# Patient Record
Sex: Female | Born: 1960
Health system: Southern US, Community
[De-identification: ages and names within clinical notes are randomized; demographics above are authoritative.]

## PROBLEM LIST (undated history)

## (undated) DIAGNOSIS — C44521 Squamous cell carcinoma of skin of breast: Secondary | ICD-10-CM

## (undated) DIAGNOSIS — M1990 Unspecified osteoarthritis, unspecified site: Secondary | ICD-10-CM

## (undated) DIAGNOSIS — F32A Depression, unspecified: Secondary | ICD-10-CM

## (undated) DIAGNOSIS — Z973 Presence of spectacles and contact lenses: Secondary | ICD-10-CM

## (undated) DIAGNOSIS — F419 Anxiety disorder, unspecified: Secondary | ICD-10-CM

## (undated) DIAGNOSIS — K5792 Diverticulitis of intestine, part unspecified, without perforation or abscess without bleeding: Secondary | ICD-10-CM

## (undated) DIAGNOSIS — R51 Headache: Secondary | ICD-10-CM

## (undated) DIAGNOSIS — R519 Headache, unspecified: Secondary | ICD-10-CM

## (undated) DIAGNOSIS — N8110 Cystocele, unspecified: Secondary | ICD-10-CM

## (undated) DIAGNOSIS — Z9889 Other specified postprocedural states: Secondary | ICD-10-CM

## (undated) DIAGNOSIS — B019 Varicella without complication: Secondary | ICD-10-CM

## (undated) DIAGNOSIS — K921 Melena: Secondary | ICD-10-CM

## (undated) DIAGNOSIS — K5909 Other constipation: Secondary | ICD-10-CM

## (undated) DIAGNOSIS — T7840XA Allergy, unspecified, initial encounter: Secondary | ICD-10-CM

## (undated) DIAGNOSIS — R112 Nausea with vomiting, unspecified: Secondary | ICD-10-CM

## (undated) DIAGNOSIS — H269 Unspecified cataract: Secondary | ICD-10-CM

## (undated) DIAGNOSIS — F329 Major depressive disorder, single episode, unspecified: Secondary | ICD-10-CM

## (undated) HISTORY — PX: COLONOSCOPY: SHX174

## (undated) HISTORY — DX: Depression, unspecified: F32.A

## (undated) HISTORY — DX: Diverticulitis of intestine, part unspecified, without perforation or abscess without bleeding: K57.92

## (undated) HISTORY — PX: APPENDECTOMY: SHX54

## (undated) HISTORY — DX: Unspecified cataract: H26.9

## (undated) HISTORY — DX: Melena: K92.1

## (undated) HISTORY — DX: Major depressive disorder, single episode, unspecified: F32.9

## (undated) HISTORY — PX: HEMORRHOID BANDING: SHX5850

## (undated) HISTORY — DX: Varicella without complication: B01.9

## (undated) HISTORY — PX: CHOLECYSTECTOMY: SHX55

## (undated) HISTORY — DX: Allergy, unspecified, initial encounter: T78.40XA

## (undated) HISTORY — PX: ABDOMINAL HYSTERECTOMY: SHX81

## (undated) HISTORY — DX: Squamous cell carcinoma of skin of breast: C44.521

## (undated) HISTORY — PX: CYSTOCELE REPAIR: SHX163

---

## 2002-11-14 HISTORY — PX: BREAST CYST ASPIRATION: SHX578

## 2004-12-07 ENCOUNTER — Ambulatory Visit: Payer: Self-pay | Admitting: Obstetrics and Gynecology

## 2006-08-02 ENCOUNTER — Ambulatory Visit: Payer: Self-pay | Admitting: Family Medicine

## 2006-08-02 ENCOUNTER — Ambulatory Visit: Payer: Self-pay | Admitting: Obstetrics and Gynecology

## 2007-09-20 ENCOUNTER — Ambulatory Visit: Payer: Self-pay

## 2007-11-21 ENCOUNTER — Ambulatory Visit: Payer: Self-pay | Admitting: Gastroenterology

## 2008-04-28 ENCOUNTER — Ambulatory Visit: Payer: Self-pay | Admitting: Obstetrics and Gynecology

## 2008-09-05 LAB — HM MAMMOGRAPHY: HM Mammogram: NORMAL

## 2009-07-03 ENCOUNTER — Emergency Department: Payer: Self-pay | Admitting: Emergency Medicine

## 2011-01-27 ENCOUNTER — Other Ambulatory Visit: Payer: Self-pay | Admitting: Internal Medicine

## 2011-11-09 ENCOUNTER — Other Ambulatory Visit: Payer: Self-pay | Admitting: *Deleted

## 2011-11-09 MED ORDER — ESTRADIOL 1 MG PO TABS: 1.0000 mg | ORAL_TABLET | Freq: Every day | ORAL | Status: DC

## 2011-11-09 NOTE — Telephone Encounter (Signed)
Faxed request from [facility], last filled 08/08/11.

## 2012-02-22 ENCOUNTER — Telehealth: Payer: Self-pay | Admitting: Internal Medicine

## 2012-02-22 ENCOUNTER — Observation Stay: Payer: Self-pay | Admitting: Surgery

## 2012-02-22 ENCOUNTER — Ambulatory Visit: Payer: Self-pay | Admitting: Internal Medicine

## 2012-02-22 DIAGNOSIS — K5792 Diverticulitis of intestine, part unspecified, without perforation or abscess without bleeding: Secondary | ICD-10-CM

## 2012-02-22 LAB — CBC
MCV: 95 fL (ref 80–100)
RDW: 12.6 % (ref 11.5–14.5)
WBC: 9 10*3/uL (ref 3.6–11.0)

## 2012-02-22 LAB — COMPREHENSIVE METABOLIC PANEL
Albumin: 3.9 g/dL (ref 3.4–5.0)
BUN: 9 mg/dL (ref 7–18)
Bilirubin,Total: 0.7 mg/dL (ref 0.2–1.0)
Co2: 27 mmol/L (ref 21–32)
Creatinine: 0.61 mg/dL (ref 0.60–1.30)
EGFR (African American): >60
EGFR (Non-African Amer.): >60
Osmolality: 273 (ref 275–301)
Potassium: 3.3 mmol/L — ABNORMAL LOW (ref 3.5–5.1)
Sodium: 137 mmol/L (ref 136–145)
Total Protein: 8 g/dL (ref 6.4–8.2)

## 2012-02-22 LAB — DIFFERENTIAL
Lymphocyte #: 2.5 10*3/uL (ref 1.0–3.6)
Monocyte #: 0.7 x10 3/mm (ref 0.2–0.9)
Neutrophil #: 5.3 10*3/uL (ref 1.4–6.5)

## 2012-02-22 LAB — PROTIME-INR: Prothrombin Time: 13 secs (ref 11.5–14.7)

## 2012-02-22 MED ORDER — METRONIDAZOLE 500 MG PO TABS: 500.0000 mg | ORAL_TABLET | Freq: Three times a day (TID) | ORAL | Status: AC

## 2012-02-22 MED ORDER — CIPROFLOXACIN HCL 500 MG PO TABS: 500.0000 mg | ORAL_TABLET | Freq: Two times a day (BID) | ORAL | Status: AC

## 2012-02-22 NOTE — Telephone Encounter (Signed)
Caller: Priscella/Patient; PCP: Duncan Dull; CB#: [phone]; Call regarding Abd Pain; Generalized abd pain onset 02/21/12, abd is tender to touch. Hx diverticulitis. See in 24 hrs per Abd Pain Protocol. Appt sched for 02/21/12 @ 0915 with Dr. Darrick Huntsman.

## 2012-02-22 NOTE — Telephone Encounter (Signed)
Patient called at 12:06.  Could be appendiciti bc pai has moved to center and right.  She needs a CT of abd and pelvis with IV/oral contrast tonight.  Cip[ro/flagyl sent to [facility] pharmacy

## 2012-02-22 NOTE — Telephone Encounter (Signed)
[facility] Triage Call Report Triage Record Num: [id number] Operator: Freddie Breech Patient Name: Reniyah Gootee Call Date & Time: 02/22/2012 10:31:40AM Patient Phone: [phone] PCP: Duncan Dull Patient Gender: Female PCP Fax : [phone] Patient DOB: 19-Mar-1961 Practice Name: [facility] Reason for Call: Caller: Eowyn/Patient; PCP: Duncan Dull; CB#: [phone]; Call regarding Abd Pain; Generalized abd pain onset 02/21/12, abd is tender to touch. Hx diverticulitis. See in 24 hrs per Abd Pain Protocol. Appt sched for 02/21/12 @ 0915 with Dr. Darrick Huntsman. Protocol(s) Used: Abdominal Pain Recommended Outcome per Protocol: See Provider within 24 hours Reason for Outcome: New onset constant mild or aching lower abdominal pain Care Advice: ~ Soak in warm bath or place heating pad set on low on abdomen to aid in pain relief. ~ Rest as much as possible. Call provider immediately if pain gets worse, localized to one spot or lasts more than 4 hours and prevents normal activity; tell provider if vomiting begins after onset of pain or if having any fever. ~ 02/22/2012 10:43:18AM Page 1 of 1 [id number]

## 2012-02-23 ENCOUNTER — Ambulatory Visit: Payer: Self-pay | Admitting: Internal Medicine

## 2012-02-27 LAB — PATHOLOGY REPORT

## 2012-02-27 NOTE — Telephone Encounter (Signed)
Patient got this done on the same day 02/22/12.  It was documented in the referral and not here.

## 2012-03-02 ENCOUNTER — Encounter: Payer: Self-pay | Admitting: Internal Medicine

## 2012-04-19 ENCOUNTER — Other Ambulatory Visit: Payer: Self-pay | Admitting: Internal Medicine

## 2012-04-19 MED ORDER — FLUOXETINE HCL 20 MG PO TABS: 20.0000 mg | ORAL_TABLET | Freq: Every day | ORAL | Status: DC

## 2012-07-02 ENCOUNTER — Telehealth: Payer: Self-pay | Admitting: Internal Medicine

## 2012-07-02 DIAGNOSIS — T753XXA Motion sickness, initial encounter: Secondary | ICD-10-CM

## 2012-07-02 MED ORDER — SCOPOLAMINE 1 MG/3DAYS TD PT72: 1.0000 | MEDICATED_PATCH | TRANSDERMAL | Status: AC

## 2012-07-02 NOTE — Telephone Encounter (Signed)
Charles advised as instructed via telephone.

## 2012-07-02 NOTE — Telephone Encounter (Signed)
i sent an rx for #10 transdermal scopalamine patches to their pharmacy .

## 2012-07-02 NOTE — Telephone Encounter (Signed)
Pt spouse Leonette Most called they are going on a cruise this week and wanted to know if they needed something for motion sickness What would you recommend they do

## 2012-08-23 ENCOUNTER — Telehealth: Payer: Self-pay | Admitting: Internal Medicine

## 2012-08-23 NOTE — Telephone Encounter (Signed)
Yes, I know Evagelia well.  She is a Animator.  You can put her in a regular 30 minute slot.  Find out if she needs a PAP smear

## 2012-08-23 NOTE — Telephone Encounter (Signed)
Pt called to get a cpx appointment.  She has not been seen in this office.  I made new pt appointment for 12/24/12.  Pt stated she was not new to you and wanted to know if she could get in early than feb for cpx not as new pt

## 2012-08-24 NOTE — Telephone Encounter (Signed)
Called pt and left message for her to call back and make appointment

## 2012-08-29 ENCOUNTER — Other Ambulatory Visit: Payer: Self-pay | Admitting: *Deleted

## 2012-08-29 MED ORDER — FLUOXETINE HCL 20 MG PO TABS: 20.0000 mg | ORAL_TABLET | Freq: Every day | ORAL | Status: DC

## 2012-08-29 NOTE — Telephone Encounter (Signed)
R'cd fax from [facility] for refill of Fluoxetine.

## 2012-09-05 ENCOUNTER — Ambulatory Visit (INDEPENDENT_AMBULATORY_CARE_PROVIDER_SITE_OTHER): Payer: PRIVATE HEALTH INSURANCE | Admitting: Internal Medicine

## 2012-09-05 ENCOUNTER — Encounter: Payer: Self-pay | Admitting: Internal Medicine

## 2012-09-05 VITALS — BP 122/74 | HR 74 | Temp 98.0°F | Ht 63.0 in | Wt 162.0 lb

## 2012-09-05 DIAGNOSIS — K5909 Other constipation: Secondary | ICD-10-CM

## 2012-09-05 DIAGNOSIS — F5105 Insomnia due to other mental disorder: Secondary | ICD-10-CM

## 2012-09-05 DIAGNOSIS — F419 Anxiety disorder, unspecified: Secondary | ICD-10-CM

## 2012-09-05 DIAGNOSIS — K5900 Constipation, unspecified: Secondary | ICD-10-CM

## 2012-09-05 DIAGNOSIS — F418 Other specified anxiety disorders: Secondary | ICD-10-CM

## 2012-09-05 DIAGNOSIS — F411 Generalized anxiety disorder: Secondary | ICD-10-CM

## 2012-09-05 DIAGNOSIS — Z1239 Encounter for other screening for malignant neoplasm of breast: Secondary | ICD-10-CM

## 2012-09-05 DIAGNOSIS — F341 Dysthymic disorder: Secondary | ICD-10-CM

## 2012-09-05 MED ORDER — LUBIPROSTONE 8 MCG PO CAPS: 8.0000 ug | ORAL_CAPSULE | Freq: Two times a day (BID) | ORAL | Status: DC

## 2012-09-05 MED ORDER — LINACLOTIDE 145 MCG PO CAPS: 145.0000 ug | ORAL_CAPSULE | Freq: Every day | ORAL | Status: DC

## 2012-09-05 NOTE — Patient Instructions (Addendum)
Try the Lunesta for the insomnia.  2  Mg or 3 mg .   Amitiza twice daily for constipation or try the 30 day free samples with voucher of Linzess once daily.   Return for annual exam.  i will set your mammogram up at [facility] check thyroid . Lipids,  CMEt  At [facility]   And iron

## 2012-09-08 ENCOUNTER — Encounter: Payer: Self-pay | Admitting: Internal Medicine

## 2012-09-08 DIAGNOSIS — K5909 Other constipation: Secondary | ICD-10-CM | POA: Insufficient documentation

## 2012-09-08 DIAGNOSIS — F5105 Insomnia due to other mental disorder: Secondary | ICD-10-CM | POA: Insufficient documentation

## 2012-09-08 DIAGNOSIS — Z1239 Encounter for other screening for malignant neoplasm of breast: Secondary | ICD-10-CM | POA: Insufficient documentation

## 2012-09-08 DIAGNOSIS — F321 Major depressive disorder, single episode, moderate: Secondary | ICD-10-CM | POA: Insufficient documentation

## 2012-09-08 DIAGNOSIS — F418 Other specified anxiety disorders: Secondary | ICD-10-CM | POA: Insufficient documentation

## 2012-09-08 NOTE — Assessment & Plan Note (Signed)
With diverticulosis on prior colonoscopy. She has had an Improved bowel regimen with trial of Amitiza;  however she is out of samples.  Since we have a 30-day free voucher for Linzess we will try this medication as well before deciding on any current ongoing prescription.

## 2012-09-08 NOTE — Assessment & Plan Note (Addendum)
Chronic depressive symptoms are managed with Prozac. Prior attempts to self weaning have been accompanied by recurrence of anhedonia and irritability. Continue Prozac for now.

## 2012-09-08 NOTE — Progress Notes (Signed)
Patient ID: IRLENE SODERQUIST, female   DOB: [birth year], 51 y.o.   MRN: [id number]  Patient Active Problem List  Diagnosis  . Insomnia secondary to anxiety  . Breast screening, unspecified  . Depression with anxiety  . Chronic constipation    Subjective:  CC:   Chief Complaint  Patient presents with  . Establish Care    HPI:   NICOYA RADERMACHER is a 51 y.o. female who presents as a new patient to establish primary care with the chief complaint of  insomnia. Patient is primary caregiver for her mother who suffered a recent stroke. 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 She denies anhedonia and irritability but does have trouble sleeping with trouble initiating sleep. She denies hypersomnolence during the day and is not overly or excessively tired during the day.  She was hospitalized several months ago for acute on chronic appendicitis found by CT scan. She has recovered well and has had no recurrent symptoms of abdominal pain.    Past Medical History  Diagnosis Date  . Depression   . Diverticulitis   . Blood in stool   . Chicken pox     Past Surgical History  Procedure Date  . Appendectomy   . Abdominal hysterectomy     Family History  Problem Relation Age of Onset  . Alcohol abuse Mother   . Arthritis Mother   . Hyperlipidemia Mother   . Stroke Mother   . Hypertension Mother   . Arthritis Father   . Hyperlipidemia Father   . Heart disease Father   . Arthritis Paternal Grandmother   . Hypertension Paternal Grandmother   . Arthritis Paternal Grandfather   . Hypertension Paternal Grandfather   . Stroke Paternal Grandfather   . Diabetes Brother     History   Social History  . Marital Status: Married    Spouse Name: N/A    Number of Children: N/A  . Years of Education: N/A   Occupational History  . Not on file.   Social History Main Topics  . Smoking status: Never Smoker   . Smokeless tobacco: Not on file  . Alcohol Use:  Yes  . Drug Use: No  . Sexually Active: Not on file   Other Topics Concern  . Not on file   Social History Narrative  . No narrative on file   Allergies  Allergen Reactions  . Sulfa Antibiotics Rash    Review of Systems:   The remainder of the review of systems was negative except those addressed in the HPI.       Objective:  BP 122/74  Pulse 74  Temp 98 F (36.7 C)  Ht 5\' 3"  (1.6 m)  Wt 162 lb (73.483 kg)  BMI 28.70 kg/m2  SpO2 97%  General appearance: alert, cooperative and appears stated age Ears: normal TM's and external ear canals both ears Throat: lips, mucosa, and tongue normal; teeth and gums normal Neck: no adenopathy, no carotid bruit, supple, symmetrical, trachea midline and thyroid not enlarged, symmetric, no tenderness/mass/nodules Back: symmetric, no curvature. ROM normal. No CVA tenderness. Lungs: clear to auscultation bilaterally Heart: regular rate and rhythm, S1, S2 normal, no murmur, click, rub or gallop Abdomen: soft, non-tender; bowel sounds normal; no masses,  no organomegaly Pulses: 2+ and symmetric Skin: Skin color, texture, turgor normal. No rashes or lesions Lymph nodes: Cervical, supraclavicular, and axillary nodes normal.  Assessment and Plan:  Insomnia secondary to anxiety Updated  by patient assuming full responsibility for her elderly mother who has had a stroke. Trial of Lunesta. Samples of 2 mg and 3 mg doses given.  Breast screening, unspecified She is behind on annual exams and mammography. Mammography ordered.  Depression with anxiety Chronic depressive symptoms are managed with Prozac. Prior attempts to self weaning have been accompanied by recurrence of anhedonia and irritability. Continue Prozac for now.  Chronic constipation With diverticulosis on prior colonoscopy. She has had an Improved bowel regimen with trial of Amitiza;  however she is out of samples.  Since we have a 30-day free voucher for Linzess we will try  this medication as well before deciding on any current ongoing prescription.   Updated Medication List Outpatient Encounter Prescriptions as of 09/05/2012  Medication Sig Dispense Refill  . estradiol (ESTRACE) 1 MG tablet Take 1 tablet (1 mg total) by mouth daily.  90 tablet  3  . FLUoxetine (PROZAC) 20 MG tablet Take 1 tablet (20 mg total) by mouth daily.  30 tablet  1  . Glucosamine 500 MG TABS Take 1,000 mg by mouth.      . naproxen sodium (ANAPROX) 220 MG tablet Take 220 mg by mouth 2 (two) times daily with a meal.      . Omega-3 Fatty Acids (FISH OIL) 500 MG CAPS Take 500 mg by mouth daily.      [name] DISCONTD: [place]-Glucosamine-Vit D (GLUCOSAMINE COMPLEX PO) Take 1,000 mg by mouth daily.      . Linaclotide (LINZESS) 145 MCG CAPS Take 1 capsule (145 mcg total) by mouth daily.  30 capsule  11  . lubiprostone (AMITIZA) 8 MCG capsule Take 1 capsule (8 mcg total) by mouth 2 (two) times daily with a meal.  60 capsule  3  . scopolamine (TRANSDERM-SCOP) 1.5 MG Place 1 patch (1.5 mg total) onto the skin every 3 (three) days.  10 patch  0     Orders Placed This Encounter  Procedures  . HM MAMMOGRAPHY  . MM Digital Screening  . HM COLONOSCOPY    No Follow-up on file.

## 2012-09-08 NOTE — Assessment & Plan Note (Signed)
Updated by patient assuming full responsibility for her elderly mother who has had a stroke. Trial of Lunesta. Samples of 2 mg and 3 mg doses given.

## 2012-09-08 NOTE — Assessment & Plan Note (Signed)
She is behind on annual exams and mammography. Mammography ordered.

## 2012-09-20 ENCOUNTER — Encounter: Payer: Self-pay | Admitting: Internal Medicine

## 2012-09-21 ENCOUNTER — Telehealth: Payer: Self-pay | Admitting: Internal Medicine

## 2012-09-21 ENCOUNTER — Encounter: Payer: Self-pay | Admitting: Internal Medicine

## 2012-09-21 MED ORDER — ESZOPICLONE 2 MG PO TABS: 2.0000 mg | ORAL_TABLET | Freq: Every day | ORAL | Status: DC

## 2012-09-24 ENCOUNTER — Ambulatory Visit: Payer: Self-pay | Admitting: Internal Medicine

## 2012-10-02 ENCOUNTER — Encounter: Payer: Self-pay | Admitting: Internal Medicine

## 2012-10-08 ENCOUNTER — Encounter: Payer: Self-pay | Admitting: Internal Medicine

## 2012-11-01 ENCOUNTER — Other Ambulatory Visit: Payer: Self-pay | Admitting: Internal Medicine

## 2012-11-01 NOTE — Telephone Encounter (Signed)
Refill request for Fluoxetine 20mg ok to refill .

## 2012-11-01 NOTE — Telephone Encounter (Signed)
Fluoxetine HCL 20 mg caps  Take 1 tablet by mouth daily  # 30

## 2012-11-04 MED ORDER — FLUOXETINE HCL 20 MG PO TABS: 20.0000 mg | ORAL_TABLET | Freq: Every day | ORAL | Status: DC

## 2012-11-09 NOTE — Telephone Encounter (Signed)
Was filled on 12.23.13

## 2012-12-03 ENCOUNTER — Other Ambulatory Visit: Payer: Self-pay | Admitting: Internal Medicine

## 2012-12-03 MED ORDER — ESTRADIOL 1 MG PO TABS: 1.0000 mg | ORAL_TABLET | Freq: Every day | ORAL | Status: DC

## 2012-12-03 NOTE — Telephone Encounter (Signed)
Estradiol 1 mg tablet  # 90

## 2012-12-03 NOTE — Telephone Encounter (Signed)
Med filled.  

## 2012-12-11 ENCOUNTER — Encounter: Payer: Self-pay | Admitting: Internal Medicine

## 2012-12-23 ENCOUNTER — Encounter: Payer: Self-pay | Admitting: Internal Medicine

## 2012-12-24 ENCOUNTER — Encounter: Payer: PRIVATE HEALTH INSURANCE | Admitting: Internal Medicine

## 2012-12-29 ENCOUNTER — Other Ambulatory Visit: Payer: Self-pay

## 2013-01-16 ENCOUNTER — Encounter: Payer: Self-pay | Admitting: Internal Medicine

## 2013-01-16 MED ORDER — FLUOXETINE HCL 20 MG PO TABS: 20.0000 mg | ORAL_TABLET | Freq: Every day | ORAL | Status: DC

## 2013-01-16 MED ORDER — ESZOPICLONE 2 MG PO TABS: 2.0000 mg | ORAL_TABLET | Freq: Every day | ORAL | Status: DC

## 2013-01-16 NOTE — Telephone Encounter (Signed)
Please call in Lunesta 2 right aide in [place] per chart update

## 2013-02-27 ENCOUNTER — Telehealth: Payer: Self-pay | Admitting: Internal Medicine

## 2013-02-27 ENCOUNTER — Ambulatory Visit (INDEPENDENT_AMBULATORY_CARE_PROVIDER_SITE_OTHER): Payer: BC Managed Care – PPO | Admitting: Internal Medicine

## 2013-02-27 ENCOUNTER — Other Ambulatory Visit (HOSPITAL_COMMUNITY)
Admission: RE | Admit: 2013-02-27 | Discharge: 2013-02-27 | Disposition: A | Discharge status: Home / Self Care | Payer: BC Managed Care – PPO | Source: Ambulatory Visit | Attending: Internal Medicine | Admitting: Internal Medicine

## 2013-02-27 ENCOUNTER — Encounter: Payer: Self-pay | Admitting: Internal Medicine

## 2013-02-27 VITALS — BP 128/68 | HR 73 | Temp 98.3°F | Resp 18 | Ht 63.0 in | Wt 167.5 lb

## 2013-02-27 DIAGNOSIS — Z13220 Encounter for screening for lipoid disorders: Secondary | ICD-10-CM

## 2013-02-27 DIAGNOSIS — Z780 Asymptomatic menopausal state: Secondary | ICD-10-CM

## 2013-02-27 DIAGNOSIS — R5381 Other malaise: Secondary | ICD-10-CM

## 2013-02-27 DIAGNOSIS — F411 Generalized anxiety disorder: Secondary | ICD-10-CM

## 2013-02-27 DIAGNOSIS — Z0000 Encounter for general adult medical examination without abnormal findings: Secondary | ICD-10-CM

## 2013-02-27 DIAGNOSIS — F5105 Insomnia due to other mental disorder: Secondary | ICD-10-CM

## 2013-02-27 DIAGNOSIS — F489 Nonpsychotic mental disorder, unspecified: Secondary | ICD-10-CM

## 2013-02-27 DIAGNOSIS — K5909 Other constipation: Secondary | ICD-10-CM

## 2013-02-27 DIAGNOSIS — Z124 Encounter for screening for malignant neoplasm of cervix: Secondary | ICD-10-CM

## 2013-02-27 DIAGNOSIS — Z01419 Encounter for gynecological examination (general) (routine) without abnormal findings: Secondary | ICD-10-CM | POA: Insufficient documentation

## 2013-02-27 DIAGNOSIS — K5900 Constipation, unspecified: Secondary | ICD-10-CM

## 2013-02-27 DIAGNOSIS — Z1151 Encounter for screening for human papillomavirus (HPV): Secondary | ICD-10-CM | POA: Insufficient documentation

## 2013-02-27 LAB — LIPID PANEL
HDL: 65.6 mg/dL (ref >39.00)
Total CHOL/HDL Ratio: 3
VLDL: 13.2 mg/dL (ref 0.0–40.0)

## 2013-02-27 LAB — LDL CHOLESTEROL, DIRECT: Direct LDL: 115.1 mg/dL

## 2013-02-27 LAB — CBC WITH DIFFERENTIAL/PLATELET
Basophils Relative: 1.3 % (ref 0.0–3.0)
Eosinophils Relative: 1.6 % (ref 0.0–5.0)
HCT: 36.6 % (ref 36.0–46.0)
Hemoglobin: 12.3 g/dL (ref 12.0–15.0)
Lymphs Abs: 1.7 10*3/uL (ref 0.7–4.0)
MCV: 93.5 fl (ref 78.0–100.0)
Monocytes Absolute: 0.5 10*3/uL (ref 0.1–1.0)
Monocytes Relative: 8.1 % (ref 3.0–12.0)
Neutro Abs: 3.5 10*3/uL (ref 1.4–7.7)
Platelets: 263 10*3/uL (ref 150.0–400.0)
WBC: 5.8 10*3/uL (ref 4.5–10.5)

## 2013-02-27 LAB — COMPREHENSIVE METABOLIC PANEL
Alkaline Phosphatase: 57 U/L (ref 39–117)
CO2: 28 mEq/L (ref 19–32)
Creatinine, Ser: 0.7 mg/dL (ref 0.4–1.2)
GFR: 94.67 mL/min (ref >60.00)
Glucose, Bld: 79 mg/dL (ref 70–99)
Total Bilirubin: 0.7 mg/dL (ref 0.3–1.2)

## 2013-02-27 MED ORDER — ESZOPICLONE 2 MG PO TABS
2.0000 mg | ORAL_TABLET | Freq: Every day | ORAL | Status: DC
Start: 2013-02-27 — End: 2013-03-11

## 2013-02-27 MED ORDER — LINACLOTIDE 145 MCG PO CAPS: 145.0000 ug | ORAL_CAPSULE | Freq: Every day | ORAL | Status: DC

## 2013-02-27 MED ORDER — ESTRADIOL 1 MG PO TABS: 1.5000 mg | ORAL_TABLET | Freq: Every day | ORAL | Status: DC

## 2013-02-27 NOTE — Progress Notes (Signed)
Patient ID: Hayley Lynn, female   DOB: 26-Apr-1961, 52 y.o.   MRN: [id number]   Subjective:     Hayley Lynn is a 52 y.o. female and is here for a comprehensive physical exam. The patient reports no problems.  History   Social History  . Marital Status: Married    Spouse Name: N/A    Number of Children: N/A  . Years of Education: N/A   Occupational History  . Not on file.   Social History Main Topics  . Smoking status: Never Smoker   . Smokeless tobacco: Not on file  . Alcohol Use: Yes  . Drug Use: No  . Sexually Active: Not on file   Other Topics Concern  . Not on file   Social History Narrative  . No narrative on file   Health Maintenance  Topic Date Due  . Pap Smear  05/13/1978  . Tetanus/tdap  05/14/1979  . Influenza Vaccine  07/15/2013  . Mammogram  10/29/2014  . Colonoscopy  09/05/2022    The following portions of the patient's history were reviewed and updated as appropriate: allergies, current medications, past family history, past medical history, past social history, past surgical history and problem list.  Review of Systems A comprehensive review of systems was negative.   Objective:    BP 128/68  Pulse 73  Temp(Src) 98.3 F (36.8 C) (Oral)  Resp 18  Ht 5\' 3"  (1.6 m)  Wt 167 lb 8 oz (75.978 kg)  BMI 29.68 kg/m2  SpO2 98% General Appearance:    Alert, cooperative, no distress, appears stated age  Head:    Normocephalic, without obvious abnormality, atraumatic  Eyes:    PERRL, conjunctiva/corneas clear, EOM's intact, fundi    benign, both eyes  Ears:    Normal TM's and external ear canals, both ears  Nose:   Nares normal, septum midline, mucosa normal, no drainage    or sinus tenderness  Throat:   Lips, mucosa, and tongue normal; teeth and gums normal  Neck:   Supple, symmetrical, trachea midline, no adenopathy;    thyroid:  no enlargement/tenderness/nodules; no carotid   bruit or JVD  Back:     Symmetric, no curvature, ROM normal, no CVA  tenderness  Lungs:     Clear to auscultation bilaterally, respirations unlabored  Chest Wall:    No tenderness or deformity   Heart:    Regular rate and rhythm, S1 and S2 normal, no murmur, rub   or gallop  Breast Exam:    No tenderness, masses, or nipple abnormality  Abdomen:     Soft, non-tender, bowel sounds active all four quadrants,    no masses, no organomegaly  Genitalia:    Pelvic: cervix normal in appearance, external genitalia normal, no adnexal masses or tenderness, no cervical motion tenderness, rectovaginal septum normal, uterus surgically absent  and vagina normal without discharge  Extremities:   Extremities normal, atraumatic, no cyanosis or edema  Pulses:   2+ and symmetric all extremities  Skin:   Skin color, texture, turgor normal, no rashes or lesions  Lymph nodes:   Cervical, supraclavicular, and axillary nodes normal  Neurologic:   CNII-XII intact, normal strength, sensation and reflexes    throughout    Assessment and Plan:  Insomnia secondary to anxiety continue Liunesta  Chronic constipation Managed with Linzess.  History of diverticulitis.   Routine general medical examination at a health care facility Annual comprehensive exam was done including breast, pelvic and PAP smear. All  screenings have been addressed  And are up to date.    Updated Medication List Outpatient Encounter Prescriptions as of 02/27/2013  Medication Sig Dispense Refill  . estradiol (ESTRACE) 1 MG tablet Take 1.5 tablets (1.5 mg total) by mouth daily. Keep on file for future refills.  Note new dose  90 tablet  3  . eszopiclone (LUNESTA) 2 MG TABS Take 1 tablet (2 mg total) by mouth at bedtime. Take immediately before bedtime  30 tablet  3  . FLUoxetine (PROZAC) 20 MG tablet Take 1 tablet (20 mg total) by mouth daily.  30 tablet  1  . Glucosamine 500 MG TABS Take 1,000 mg by mouth.      . Linaclotide (LINZESS) 145 MCG CAPS Take 1 capsule (145 mcg total) by mouth daily.  30 capsule  11   . naproxen sodium (ANAPROX) 220 MG tablet Take 220 mg by mouth 2 (two) times daily with a meal.      . Omega-3 Fatty Acids (FISH OIL) 500 MG CAPS Take 500 mg by mouth daily.      . [DISCONTINUED] estradiol (ESTRACE) 1 MG tablet Take 1 tablet (1 mg total) by mouth daily.  90 tablet  3  . [DISCONTINUED] eszopiclone (LUNESTA) 2 MG TABS Take 1 tablet (2 mg total) by mouth at bedtime. Take immediately before bedtime  30 tablet  3  . [DISCONTINUED] Linaclotide (LINZESS) 145 MCG CAPS Take 1 capsule (145 mcg total) by mouth daily.  30 capsule  11  . scopolamine (TRANSDERM-SCOP) 1.5 MG Place 1 patch (1.5 mg total) onto the skin every 3 (three) days.  10 patch  0  . [DISCONTINUED] lubiprostone (AMITIZA) 8 MCG capsule Take 1 capsule (8 mcg total) by mouth 2 (two) times daily with a meal.  60 capsule  3   No facility-administered encounter medications on file as of 02/27/2013.

## 2013-02-28 DIAGNOSIS — Z0000 Encounter for general adult medical examination without abnormal findings: Secondary | ICD-10-CM | POA: Insufficient documentation

## 2013-02-28 NOTE — Assessment & Plan Note (Signed)
Annual comprehensive exam was done including breast, pelvic and PAP smear. All screenings have been addressed  And are up to date.

## 2013-02-28 NOTE — Assessment & Plan Note (Addendum)
continue [place]

## 2013-02-28 NOTE — Progress Notes (Signed)
RX Linzess faxed to pharmacy.

## 2013-02-28 NOTE — Assessment & Plan Note (Signed)
Managed with Linzess.  History of diverticulitis.    

## 2013-03-04 ENCOUNTER — Encounter: Payer: Self-pay | Admitting: Internal Medicine

## 2013-03-05 ENCOUNTER — Encounter: Payer: Self-pay | Admitting: Internal Medicine

## 2013-03-11 ENCOUNTER — Encounter: Payer: Self-pay | Admitting: Internal Medicine

## 2013-03-11 DIAGNOSIS — F419 Anxiety disorder, unspecified: Secondary | ICD-10-CM

## 2013-03-11 DIAGNOSIS — F5105 Insomnia due to other mental disorder: Secondary | ICD-10-CM

## 2013-03-11 NOTE — Telephone Encounter (Signed)
Pt sent MyChart message asking for Rx for Lunesta. Checked with [facility], they do not have a Rx for pt. Could you please print again to fax?

## 2013-03-12 MED ORDER — ESZOPICLONE 2 MG PO TABS: 2.0000 mg | ORAL_TABLET | Freq: Every day | ORAL | Status: DC

## 2013-03-12 NOTE — Telephone Encounter (Signed)
Ok to refill,  printed rx  

## 2013-03-13 NOTE — Telephone Encounter (Signed)
Script faxed to pharmacy

## 2013-03-17 ENCOUNTER — Other Ambulatory Visit: Payer: Self-pay | Admitting: Internal Medicine

## 2013-03-18 NOTE — Telephone Encounter (Signed)
Rx sent to pharmacy by escript  

## 2013-04-16 ENCOUNTER — Encounter: Payer: Self-pay | Admitting: Internal Medicine

## 2013-04-18 ENCOUNTER — Telehealth: Payer: Self-pay | Admitting: *Deleted

## 2013-04-18 NOTE — Telephone Encounter (Signed)
Called [phone] for prior authorization on the Eszopiclone 2 mg tab, form is being faxed over

## 2013-05-22 ENCOUNTER — Telehealth: Payer: Self-pay | Admitting: *Deleted

## 2013-05-22 DIAGNOSIS — F5105 Insomnia due to other mental disorder: Secondary | ICD-10-CM

## 2013-05-22 NOTE — Telephone Encounter (Signed)
[facility] Eszopiclone tablet was APPROVED   06.14.2014-07.05.2015

## 2013-05-23 MED ORDER — ESZOPICLONE 2 MG PO TABS: 2.0000 mg | ORAL_TABLET | Freq: Every day | ORAL | Status: DC

## 2013-05-23 NOTE — Addendum Note (Signed)
Addended by: Sherlene Shams on: 05/23/2013 03:02 PM   Modules accepted: Orders

## 2013-05-23 NOTE — Telephone Encounter (Signed)
Ok, new script for 90 days printed

## 2013-05-27 NOTE — Telephone Encounter (Signed)
Rx faxed to pharmacy  

## 2013-06-19 ENCOUNTER — Other Ambulatory Visit: Payer: Self-pay | Admitting: Internal Medicine

## 2013-09-03 ENCOUNTER — Other Ambulatory Visit: Payer: Self-pay | Admitting: Adult Health

## 2013-09-03 ENCOUNTER — Telehealth: Payer: Self-pay | Admitting: *Deleted

## 2013-09-03 ENCOUNTER — Encounter: Payer: Self-pay | Admitting: Adult Health

## 2013-09-03 ENCOUNTER — Ambulatory Visit (INDEPENDENT_AMBULATORY_CARE_PROVIDER_SITE_OTHER): Payer: BC Managed Care – PPO | Admitting: Adult Health

## 2013-09-03 ENCOUNTER — Ambulatory Visit: Payer: Self-pay | Admitting: Adult Health

## 2013-09-03 VITALS — BP 102/58 | HR 68 | Temp 98.2°F | Resp 12 | Wt 160.5 lb

## 2013-09-03 DIAGNOSIS — R899 Unspecified abnormal finding in specimens from other organs, systems and tissues: Secondary | ICD-10-CM

## 2013-09-03 DIAGNOSIS — R1011 Right upper quadrant pain: Secondary | ICD-10-CM | POA: Insufficient documentation

## 2013-09-03 DIAGNOSIS — R109 Unspecified abdominal pain: Secondary | ICD-10-CM

## 2013-09-03 LAB — BASIC METABOLIC PANEL
BUN: 10 mg/dL (ref 6–23)
CO2: 29 mEq/L (ref 19–32)
Chloride: 103 mEq/L (ref 96–112)
Creatinine, Ser: 0.7 mg/dL (ref 0.4–1.2)
Glucose, Bld: 90 mg/dL (ref 70–99)
Potassium: 4.6 mEq/L (ref 3.5–5.1)
Sodium: 139 mEq/L (ref 135–145)

## 2013-09-03 LAB — CBC WITH DIFFERENTIAL/PLATELET
Basophils Relative: 0.2 % (ref 0.0–3.0)
Eosinophils Relative: 25.1 % — ABNORMAL HIGH (ref 0.0–5.0)
Lymphocytes Relative: 22.2 % (ref 12.0–46.0)
MCV: 93.6 fl (ref 78.0–100.0)
Monocytes Absolute: 0.4 10*3/uL (ref 0.1–1.0)
Monocytes Relative: 4.2 % (ref 3.0–12.0)
Neutrophils Relative %: 48.3 % (ref 43.0–77.0)
Platelets: 255 10*3/uL (ref 150.0–400.0)
RBC: 4.04 Mil/uL (ref 3.87–5.11)
WBC: 9.7 10*3/uL (ref 4.5–10.5)

## 2013-09-03 LAB — HEPATIC FUNCTION PANEL
ALT: 13 U/L (ref 0–35)
Alkaline Phosphatase: 55 U/L (ref 39–117)
Bilirubin, Direct: 0.1 mg/dL (ref 0.0–0.3)
Total Bilirubin: 0.5 mg/dL (ref 0.3–1.2)
Total Protein: 6.3 g/dL (ref 6.0–8.3)

## 2013-09-03 LAB — LIPASE: Lipase: 28 U/L (ref 11.0–59.0)

## 2013-09-03 LAB — AMYLASE: Amylase: 61 U/L (ref 27–131)

## 2013-09-03 NOTE — Telephone Encounter (Signed)
Mallory @ [facility] called with preliminary report, GB normal. Radiologist will be in soon to read and will send final report. Raquel notified

## 2013-09-03 NOTE — Assessment & Plan Note (Signed)
Check CBC, metabolic panel, hepatic panel, amylase and lipase. Complete abdominal ultrasound. ? Gallstones.

## 2013-09-03 NOTE — Progress Notes (Signed)
  Subjective:    Patient ID: Hayley Lynn, female    DOB: [birth year], 52 y.o.   MRN: [id number]  HPI  Patient is a pleasant 52 year old female who presents to clinic with the following symptoms:  Upset stomach after meals, nausea Fullness feeling after meals. Early satiety Increased burping Symptoms x 1 month Diarrhea after meals x 1 week Stool is yellow to light brown No fever, chills No hx of GERD or ulcers No blood or dark stools reported.   Current Outpatient Prescriptions on File Prior to Visit  Medication Sig Dispense Refill  . estradiol (ESTRACE) 1 MG tablet Take 1.5 tablets (1.5 mg total) by mouth daily. Keep on file for future refills.  Note new dose  90 tablet  3  . eszopiclone (LUNESTA) 2 MG TABS Take 1 tablet (2 mg total) by mouth at bedtime. Take immediately before bedtime  90 tablet  3  . FLUoxetine (PROZAC) 20 MG capsule take 1 capsule by mouth once daily  30 capsule  2  . Glucosamine 500 MG TABS Take 1,000 mg by mouth.      . naproxen sodium (ANAPROX) 220 MG tablet Take 220 mg by mouth 2 (two) times daily with a meal.       No current facility-administered medications on file prior to visit.     Review of Systems  Constitutional: Negative for fever and chills.  Gastrointestinal: Positive for nausea, vomiting, abdominal pain and diarrhea. Negative for constipation and blood in stool.       Objective:   Physical Exam  Constitutional: She is oriented to person, place, and time. She appears well-developed and well-nourished. No distress.  Abdominal: Soft. She exhibits no mass. There is tenderness. There is no rebound and no guarding.  Hyperactive bowel sounds. Right upper quadrant and epigastric pain with deep palpation  Neurological: She is alert and oriented to person, place, and time.  Skin: Skin is warm and dry.  Psychiatric: She has a normal mood and affect. Her behavior is normal. Judgment and thought content normal.          Assessment & Plan:

## 2013-09-05 ENCOUNTER — Ambulatory Visit: Payer: Self-pay | Admitting: Adult Health

## 2013-09-06 ENCOUNTER — Encounter: Payer: Self-pay | Admitting: Internal Medicine

## 2013-09-06 ENCOUNTER — Telehealth: Payer: Self-pay | Admitting: *Deleted

## 2013-09-06 DIAGNOSIS — R739 Hyperglycemia, unspecified: Secondary | ICD-10-CM

## 2013-09-06 NOTE — Telephone Encounter (Signed)
Spoke with radiology told them to do an re-read of CT scan to mention ovaries

## 2013-09-08 ENCOUNTER — Encounter: Payer: Self-pay | Admitting: Internal Medicine

## 2013-09-08 DIAGNOSIS — R197 Diarrhea, unspecified: Secondary | ICD-10-CM

## 2013-09-09 ENCOUNTER — Other Ambulatory Visit: Payer: Self-pay | Admitting: Internal Medicine

## 2013-09-09 ENCOUNTER — Encounter: Payer: Self-pay | Admitting: Internal Medicine

## 2013-09-09 DIAGNOSIS — R197 Diarrhea, unspecified: Secondary | ICD-10-CM

## 2013-09-11 ENCOUNTER — Other Ambulatory Visit (INDEPENDENT_AMBULATORY_CARE_PROVIDER_SITE_OTHER): Payer: BC Managed Care – PPO

## 2013-09-11 DIAGNOSIS — R197 Diarrhea, unspecified: Secondary | ICD-10-CM

## 2013-09-12 ENCOUNTER — Encounter: Payer: Self-pay | Admitting: Internal Medicine

## 2013-09-12 ENCOUNTER — Other Ambulatory Visit (INDEPENDENT_AMBULATORY_CARE_PROVIDER_SITE_OTHER): Payer: BC Managed Care – PPO

## 2013-09-12 DIAGNOSIS — R197 Diarrhea, unspecified: Secondary | ICD-10-CM

## 2013-09-12 DIAGNOSIS — R140 Abdominal distension (gaseous): Secondary | ICD-10-CM | POA: Insufficient documentation

## 2013-09-12 LAB — FECAL LACTOFERRIN, QUANT: Lactoferrin: NEGATIVE

## 2013-09-12 LAB — CLOSTRIDIUM DIFFICILE EIA: CDIFTX: NEGATIVE

## 2013-09-12 NOTE — Addendum Note (Signed)
Addended by: Sherlene Shams on: 09/12/2013 01:21 PM   Modules accepted: Orders

## 2013-09-13 LAB — CA 125: CA 125: 9 U/mL (ref 0.0–30.2)

## 2013-09-15 ENCOUNTER — Encounter: Payer: Self-pay | Admitting: Internal Medicine

## 2013-09-15 LAB — STOOL CULTURE

## 2013-09-17 ENCOUNTER — Other Ambulatory Visit: Payer: Self-pay | Admitting: Internal Medicine

## 2013-09-18 ENCOUNTER — Encounter: Payer: Self-pay | Admitting: Adult Health

## 2013-09-19 ENCOUNTER — Other Ambulatory Visit (INDEPENDENT_AMBULATORY_CARE_PROVIDER_SITE_OTHER): Payer: BC Managed Care – PPO

## 2013-09-19 ENCOUNTER — Other Ambulatory Visit: Payer: Self-pay

## 2013-09-19 ENCOUNTER — Ambulatory Visit: Payer: Self-pay | Admitting: Internal Medicine

## 2013-09-19 DIAGNOSIS — R739 Hyperglycemia, unspecified: Secondary | ICD-10-CM

## 2013-09-19 DIAGNOSIS — R6889 Other general symptoms and signs: Secondary | ICD-10-CM

## 2013-09-19 DIAGNOSIS — R7309 Other abnormal glucose: Secondary | ICD-10-CM

## 2013-09-19 DIAGNOSIS — R899 Unspecified abnormal finding in specimens from other organs, systems and tissues: Secondary | ICD-10-CM

## 2013-09-20 ENCOUNTER — Encounter: Payer: Self-pay | Admitting: Internal Medicine

## 2013-09-20 ENCOUNTER — Telehealth: Payer: Self-pay | Admitting: Internal Medicine

## 2013-09-20 LAB — HEMOGLOBIN A1C: Hgb A1c MFr Bld: 5.3 % (ref 4.6–6.5)

## 2013-09-20 LAB — CBC WITH DIFFERENTIAL/PLATELET
Basophils Absolute: 0 10*3/uL (ref 0.0–0.1)
Basophils Relative: 0.4 % (ref 0.0–3.0)
Eosinophils Absolute: 0.4 10*3/uL (ref 0.0–0.7)
Eosinophils Relative: 6.5 % — ABNORMAL HIGH (ref 0.0–5.0)
HCT: 34.5 % — ABNORMAL LOW (ref 36.0–46.0)
Lymphs Abs: 2 10*3/uL (ref 0.7–4.0)
MCHC: 34 g/dL (ref 30.0–36.0)
MCV: 93.7 fl (ref 78.0–100.0)
Monocytes Absolute: 0.4 10*3/uL (ref 0.1–1.0)
Neutro Abs: 3.8 10*3/uL (ref 1.4–7.7)
Platelets: 267 10*3/uL (ref 150.0–400.0)
WBC: 6.7 10*3/uL (ref 4.5–10.5)

## 2013-09-21 ENCOUNTER — Encounter: Payer: Self-pay | Admitting: Internal Medicine

## 2013-09-23 ENCOUNTER — Encounter: Payer: Self-pay | Admitting: Adult Health

## 2013-09-24 ENCOUNTER — Encounter: Payer: Self-pay | Admitting: Internal Medicine

## 2013-09-24 DIAGNOSIS — G8929 Other chronic pain: Secondary | ICD-10-CM

## 2013-10-04 ENCOUNTER — Encounter: Payer: Self-pay | Admitting: Internal Medicine

## 2013-10-24 ENCOUNTER — Ambulatory Visit: Payer: Self-pay | Admitting: Gastroenterology

## 2013-11-10 ENCOUNTER — Other Ambulatory Visit: Payer: Self-pay | Admitting: Internal Medicine

## 2013-12-10 ENCOUNTER — Other Ambulatory Visit: Payer: Self-pay | Admitting: Internal Medicine

## 2013-12-11 NOTE — Telephone Encounter (Signed)
Ok to refill,  printed rx  

## 2013-12-11 NOTE — Telephone Encounter (Signed)
Ok refill? 

## 2013-12-22 ENCOUNTER — Other Ambulatory Visit: Payer: Self-pay | Admitting: Internal Medicine

## 2014-01-21 ENCOUNTER — Other Ambulatory Visit: Payer: Self-pay | Admitting: Internal Medicine

## 2014-01-22 ENCOUNTER — Encounter: Payer: Self-pay | Admitting: Internal Medicine

## 2014-01-22 NOTE — Telephone Encounter (Signed)
Last non acute visit was 02/27/13. Sent pt mychart message requesting to schedule an appointment.

## 2014-01-24 MED ORDER — FLUOXETINE HCL 20 MG PO CAPS: 20.0000 mg | ORAL_CAPSULE | Freq: Every day | ORAL | Status: DC

## 2014-02-20 DIAGNOSIS — IMO0002 Reserved for concepts with insufficient information to code with codable children: Secondary | ICD-10-CM | POA: Insufficient documentation

## 2014-02-20 DIAGNOSIS — R252 Cramp and spasm: Secondary | ICD-10-CM | POA: Insufficient documentation

## 2014-02-20 DIAGNOSIS — N3281 Overactive bladder: Secondary | ICD-10-CM | POA: Insufficient documentation

## 2014-02-20 DIAGNOSIS — N816 Rectocele: Secondary | ICD-10-CM | POA: Insufficient documentation

## 2014-06-27 ENCOUNTER — Encounter: Payer: Self-pay | Admitting: Internal Medicine

## 2014-06-28 ENCOUNTER — Encounter: Payer: Self-pay | Admitting: Internal Medicine

## 2014-06-30 ENCOUNTER — Other Ambulatory Visit: Payer: Self-pay | Admitting: Internal Medicine

## 2014-06-30 ENCOUNTER — Telehealth: Payer: Self-pay | Admitting: *Deleted

## 2014-06-30 MED ORDER — SULFAMETHOXAZOLE-TMP DS 800-160 MG PO TABS: 1.0000 | ORAL_TABLET | Freq: Two times a day (BID) | ORAL | Status: DC

## 2014-06-30 MED ORDER — CEPHALEXIN 500 MG PO CAPS: 500.0000 mg | ORAL_CAPSULE | Freq: Four times a day (QID) | ORAL | Status: DC

## 2014-06-30 NOTE — Telephone Encounter (Signed)
Fax from [facility]. Received escript for Bactrim, they have pt with sulfa allergy

## 2014-06-30 NOTE — Telephone Encounter (Signed)
My bad.,  Keflex rx sent to [facility] instead

## 2014-06-30 NOTE — Telephone Encounter (Signed)
Refill

## 2014-07-01 NOTE — Telephone Encounter (Signed)
Responded to pt's mychart message, see other encounter

## 2014-07-02 NOTE — Telephone Encounter (Signed)
Ok to refill, 

## 2014-07-02 NOTE — Telephone Encounter (Signed)
Faxed to pharmacy

## 2014-07-04 ENCOUNTER — Telehealth: Payer: Self-pay | Admitting: *Deleted

## 2014-07-04 NOTE — Telephone Encounter (Signed)
Fax from [employer], needing PA for [employer]. Started PA, given to Dr Derrel Nip for signature

## 2014-07-08 ENCOUNTER — Other Ambulatory Visit: Payer: Self-pay | Admitting: Internal Medicine

## 2014-07-08 ENCOUNTER — Encounter: Payer: Self-pay | Admitting: Internal Medicine

## 2014-07-08 NOTE — Telephone Encounter (Signed)
No i dont think so

## 2014-07-08 NOTE — Telephone Encounter (Signed)
Have you received this PA if not Have new form in red folder.

## 2014-07-09 NOTE — Telephone Encounter (Signed)
Yes you signed nad has been faxed sent to billing and scan.

## 2014-07-14 DIAGNOSIS — Z0279 Encounter for issue of other medical certificate: Secondary | ICD-10-CM

## 2014-07-28 ENCOUNTER — Ambulatory Visit (INDEPENDENT_AMBULATORY_CARE_PROVIDER_SITE_OTHER)
Admission: RE | Admit: 2014-07-28 | Discharge: 2014-07-28 | Disposition: A | Discharge status: Home / Self Care | Payer: BC Managed Care – PPO | Source: Ambulatory Visit | Attending: Internal Medicine | Admitting: Internal Medicine

## 2014-07-28 ENCOUNTER — Ambulatory Visit (INDEPENDENT_AMBULATORY_CARE_PROVIDER_SITE_OTHER): Payer: BC Managed Care – PPO | Admitting: Internal Medicine

## 2014-07-28 ENCOUNTER — Encounter: Payer: Self-pay | Admitting: Internal Medicine

## 2014-07-28 VITALS — BP 100/60 | HR 71 | Temp 97.9°F | Resp 16 | Ht 63.0 in | Wt 165.8 lb

## 2014-07-28 DIAGNOSIS — F5105 Insomnia due to other mental disorder: Secondary | ICD-10-CM

## 2014-07-28 DIAGNOSIS — F419 Anxiety disorder, unspecified: Secondary | ICD-10-CM

## 2014-07-28 DIAGNOSIS — F341 Dysthymic disorder: Secondary | ICD-10-CM

## 2014-07-28 DIAGNOSIS — N952 Postmenopausal atrophic vaginitis: Secondary | ICD-10-CM

## 2014-07-28 DIAGNOSIS — R0789 Other chest pain: Secondary | ICD-10-CM

## 2014-07-28 DIAGNOSIS — R5383 Other fatigue: Secondary | ICD-10-CM

## 2014-07-28 DIAGNOSIS — F411 Generalized anxiety disorder: Secondary | ICD-10-CM

## 2014-07-28 DIAGNOSIS — F418 Other specified anxiety disorders: Secondary | ICD-10-CM

## 2014-07-28 DIAGNOSIS — R5381 Other malaise: Secondary | ICD-10-CM

## 2014-07-28 DIAGNOSIS — F489 Nonpsychotic mental disorder, unspecified: Secondary | ICD-10-CM

## 2014-07-28 LAB — LIPID PANEL
CHOL/HDL RATIO: 4
CHOLESTEROL: 214 mg/dL — AB (ref 0–200)
HDL: 57 mg/dL (ref >39.00)
LDL CALC: 139 mg/dL — AB (ref 0–99)
NonHDL: 157
TRIGLYCERIDES: 92 mg/dL (ref 0.0–149.0)
VLDL: 18.4 mg/dL (ref 0.0–40.0)

## 2014-07-28 LAB — CBC WITH DIFFERENTIAL/PLATELET
Basophils Absolute: 0 10*3/uL (ref 0.0–0.1)
Basophils Relative: 0.7 % (ref 0.0–3.0)
Eosinophils Absolute: 0.1 10*3/uL (ref 0.0–0.7)
Eosinophils Relative: 1.7 % (ref 0.0–5.0)
HCT: 36.6 % (ref 36.0–46.0)
Hemoglobin: 12.3 g/dL (ref 12.0–15.0)
Lymphocytes Relative: 30.1 % (ref 12.0–46.0)
Lymphs Abs: 2 10*3/uL (ref 0.7–4.0)
MCHC: 33.7 g/dL (ref 30.0–36.0)
MCV: 94.1 fl (ref 78.0–100.0)
Monocytes Absolute: 0.5 10*3/uL (ref 0.1–1.0)
Monocytes Relative: 6.7 % (ref 3.0–12.0)
Neutro Abs: 4.1 10*3/uL (ref 1.4–7.7)
Neutrophils Relative %: 60.8 % (ref 43.0–77.0)
Platelets: 263 10*3/uL (ref 150.0–400.0)
RBC: 3.89 Mil/uL (ref 3.87–5.11)
RDW: 12.4 % (ref 11.5–15.5)
WBC: 6.7 10*3/uL (ref 4.0–10.5)

## 2014-07-28 LAB — COMPREHENSIVE METABOLIC PANEL
ALK PHOS: 53 U/L (ref 39–117)
ALT: 9 U/L (ref 0–35)
AST: 22 U/L (ref 0–37)
Albumin: 3.6 g/dL (ref 3.5–5.2)
BUN: 17 mg/dL (ref 6–23)
CO2: 28 mEq/L (ref 19–32)
Calcium: 8.8 mg/dL (ref 8.4–10.5)
Chloride: 104 mEq/L (ref 96–112)
Creatinine, Ser: 0.7 mg/dL (ref 0.4–1.2)
GFR: 99.12 mL/min (ref >60.00)
Glucose, Bld: 81 mg/dL (ref 70–99)
Potassium: 4.3 mEq/L (ref 3.5–5.1)
Sodium: 138 mEq/L (ref 135–145)
Total Bilirubin: 0.4 mg/dL (ref 0.2–1.2)
Total Protein: 6.7 g/dL (ref 6.0–8.3)

## 2014-07-28 LAB — TSH: TSH: 2.26 u[IU]/mL (ref 0.35–4.50)

## 2014-07-28 MED ORDER — ESTRADIOL 1 MG PO TABS: ORAL_TABLET | ORAL | Status: DC

## 2014-07-28 MED ORDER — ESTRADIOL 0.1 MG/GM VA CREA: 1.0000 g | TOPICAL_CREAM | Freq: Every day | VAGINAL | Status: DC

## 2014-07-28 MED ORDER — ALPRAZOLAM 0.5 MG PO TABS: 0.5000 mg | ORAL_TABLET | Freq: Every evening | ORAL | Status: DC | PRN

## 2014-07-28 MED ORDER — FLUOXETINE HCL 20 MG PO CAPS: 20.0000 mg | ORAL_CAPSULE | Freq: Every day | ORAL | Status: DC

## 2014-07-28 NOTE — Assessment & Plan Note (Signed)
Trial of estrace .  Refilling estradiol for severe hot flashes

## 2014-07-28 NOTE — Progress Notes (Signed)
Pre-visit discussion using our clinic review tool. No additional management support is needed unless otherwise documented below in the visit note.  

## 2014-07-28 NOTE — Progress Notes (Signed)
Patient ID: Hayley Lynn, female   DOB: 1961/04/22, 53 y.o.   MRN: [id number]  Patient Active Problem List   Diagnosis Date Noted  . Chest pain 07/29/2014  . Postmenopausal atrophic vaginitis 07/28/2014  . Bloating symptom 09/12/2013  . RUQ abdominal pain 09/03/2013  . Routine general medical examination at a health care facility 02/28/2013  . Insomnia secondary to anxiety 09/08/2012  . Breast screening, unspecified 09/08/2012  . Depression with anxiety 09/08/2012  . Chronic constipation 09/08/2012    Subjective:  CC:   Chief Complaint  Patient presents with  . Follow-up    medication refills    HPI:   Hayley Lynn is a 53 y.o. female who presents for Anxiety.  Emotional stressors aggravating her anxiety .   She had to place her mother in a nursing home after she fell and broke her hip and has had to miss work because of her mother's decline. .    Vulvar lesions on the  left side Did not improve with empiric septra.  Area feels rough.   Menopause.  Has been off or oral estrogen x 2 weeks because they ran out and staff would not fill without an ffice visit.  Needs refills on everything   Insomnia.  She has been only getting 15 tablets of lunesta  per month,  Does not sleep without it.  Had an episode of chest pain radiating to left arm .  Started last summer excruciating pain,  Occurred while sanding furniture with a power sander.  The pain resolved after resting for 30 minutes.  Since Mom became ill, has been ocurring frequently,  Left arm aching   Some chest.  No jaw pain . No diaphoresis or nausea.    Past Medical History  Diagnosis Date  . Depression   . Diverticulitis   . Blood in stool   . Chicken pox     Past Surgical History  Procedure Laterality Date  . Appendectomy    . Abdominal hysterectomy         The following portions of the patient's history were reviewed and updated as appropriate: Allergies, current medications, and problem list.    Review  of Systems:   Patient denies headache, fevers, malaise, unintentional weight loss, skin rash, eye pain, sinus congestion and sinus pain, sore throat, dysphagia,  hemoptysis , cough, dyspnea, wheezing, chest pain, palpitations, orthopnea, edema, abdominal pain, nausea, melena, diarrhea, constipation, flank pain, dysuria, hematuria, urinary  Frequency, nocturia, numbness, tingling, seizures,  Focal weakness, Loss of consciousness,  Tremor, insomnia, depression, anxiety, and suicidal ideation.     History   Social History  . Marital Status: Married    Spouse Name: N/A    Number of Children: N/A  . Years of Education: N/A   Occupational History  . Not on file.   Social History Main Topics  . Smoking status: Never Smoker   . Smokeless tobacco: Not on file  . Alcohol Use: Yes  . Drug Use: No  . Sexual Activity: Not on file   Other Topics Concern  . Not on file   Social History Narrative  . No narrative on file    Objective:  Filed Vitals:   07/28/14 0814  BP: 100/60  Pulse: 71  Temp: 97.9 F (36.6 C)  Resp: 16     General Appearance:    Alert, cooperative, no distress, appears stated age  Head:    Normocephalic, without obvious abnormality, atraumatic  Eyes:    PERRL,  conjunctiva/corneas clear, EOM's intact, fundi    benign, both eyes  Ears:    Normal TM's and external ear canals, both ears  Nose:   Nares normal, septum midline, mucosa normal, no drainage    or sinus tenderness  Throat:   Lips, mucosa, and tongue normal; teeth and gums normal  Neck:   Supple, symmetrical, trachea midline, no adenopathy;    thyroid:  no enlargement/tenderness/nodules; no carotid   bruit or JVD  Back:     Symmetric, no curvature, ROM normal, no CVA tenderness  Lungs:     Clear to auscultation bilaterally, respirations unlabored  Chest Wall:    No tenderness or deformity   Heart:    Regular rate and rhythm, S1 and S2 normal, no murmur, rub   or gallop  Breast Exam:    No tenderness,  masses, or nipple abnormality  Abdomen:     Soft, non-tender, bowel sounds active all four quadrants,    no masses, no organomegaly  Genitalia:    Pelvic: cervix normal in appearance, external genitalia normal, no adnexal masses or tenderness, no cervical motion tenderness, rectovaginal septum normal, uterus normal size, shape, and consistency and vagina normal without discharge  Extremities:   Extremities normal, atraumatic, no cyanosis or edema  Pulses:   2+ and symmetric all extremities  Skin:   Skin color, texture, turgor normal, no rashes or lesions  Lymph nodes:   Cervical, supraclavicular, and axillary nodes normal  Neurologic:   CNII-XII intact, normal strength, sensation and reflexes    throughout    Assessment and Plan:  Postmenopausal atrophic vaginitis Trial of estrace .  Refilling estradiol for severe hot flashes   Insomnia secondary to anxiety Managed with lunesta but only given 15/month,  Alprazolam for the other night discussed     Chest pain Atypical,  Occuring with emotional stress.  Her only cardiac risks including postmenopausal status and use of HRT.  Lipids are normal and EKG is normal.  Will treat anxiety and consider referral for stress testing if persistent.  A total of 40 minutes was spent with patient more than half of which was spent in counseling patient on the above mentioned issues , reviewing and explaining recent labsand coordination of care.  Updated Medication List Outpatient Encounter Prescriptions as of 07/28/2014  Medication Sig  . estradiol (ESTRACE) 1 MG tablet take 1 and 1/2 tablets by mouth once daily  . eszopiclone (LUNESTA) 2 MG TABS tablet take 1 tablet by mouth at bedtime  . FLUoxetine (PROZAC) 20 MG capsule Take 1 capsule (20 mg total) by mouth daily.  . [DISCONTINUED] estradiol (ESTRACE) 1 MG tablet take 1 and 1/2 tablets by mouth once daily  . [DISCONTINUED] FLUoxetine (PROZAC) 20 MG capsule Take 1 capsule (20 mg total) by mouth daily.   [name] ALPRAZolam (XANAX) 0.5 MG tablet Take 1 tablet (0.5 mg total) by mouth at bedtime as needed for anxiety.  [name] estradiol (ESTRACE) 0.1 MG/GM vaginal cream Place 2.13 Applicatorfuls vaginally daily. For two weeks,  Then twice weekly thereafter  . [DISCONTINUED] cephALEXin (KEFLEX) 500 MG capsule Take 1 capsule (500 mg total) by mouth 4 (four) times daily.  . [DISCONTINUED] Glucosamine 500 MG TABS Take 1,000 mg by mouth.  . [DISCONTINUED] naproxen sodium (ANAPROX) 220 MG tablet Take 220 mg by mouth 2 (two) times daily with a meal.  . [DISCONTINUED] sulfamethoxazole-trimethoprim (BACTRIM DS) 800-160 MG per tablet Take 1 tablet by mouth 2 (two) times daily.     Orders Placed This  Encounter  Procedures  . DG Chest 2 View  . CBC with Differential  . Comprehensive metabolic panel  . TSH  . Lipid panel  . CK total and CKMB (cardiac)  . EKG 12-Lead    No Follow-up on file.

## 2014-07-28 NOTE — Patient Instructions (Addendum)
I am treating you for atrophic vaginitis.  Please return for a repeat pelvic e3xam in 3 months   Alprazolam as needed for insomnia    Please go to the [place] office at [facility] for your chest x ray .  No appt needed

## 2014-07-28 NOTE — Assessment & Plan Note (Addendum)
Managed with lunesta but only given 15/month,  Alprazolam for the other night discussed

## 2014-07-29 ENCOUNTER — Encounter: Payer: Self-pay | Admitting: Internal Medicine

## 2014-07-29 DIAGNOSIS — R079 Chest pain, unspecified: Secondary | ICD-10-CM | POA: Insufficient documentation

## 2014-07-29 LAB — CK TOTAL AND CKMB (NOT AT ARMC)
CK TOTAL: 52 U/L (ref 7–177)
CK, MB: 0.7 ng/mL (ref 0.0–5.0)

## 2014-07-29 NOTE — Assessment & Plan Note (Signed)
Atypical,  Occuring with emotional stress.  Her only cardiac risks including postmenopausal status and use of HRT.  Lipids are normal and EKG is normal.  Will treat anxiety and consider referral for stress testing if persistent.

## 2014-07-31 ENCOUNTER — Encounter: Payer: Self-pay | Admitting: Internal Medicine

## 2014-07-31 MED ORDER — ESTROGENS, CONJUGATED 0.625 MG/GM VA CREA: 1.0000 | TOPICAL_CREAM | Freq: Every day | VAGINAL | Status: DC

## 2014-11-26 ENCOUNTER — Other Ambulatory Visit: Payer: Self-pay | Admitting: Internal Medicine

## 2014-11-27 NOTE — Telephone Encounter (Signed)
Last visit 07/28/14, ok refill?

## 2014-12-02 NOTE — Telephone Encounter (Signed)
Ok to refill,  Authorized in epic please phone in

## 2014-12-02 NOTE — Telephone Encounter (Signed)
Called to pharmacy 

## 2015-01-08 ENCOUNTER — Other Ambulatory Visit: Payer: Self-pay | Admitting: Internal Medicine

## 2015-01-09 NOTE — Telephone Encounter (Signed)
rx faxed

## 2015-01-09 NOTE — Telephone Encounter (Signed)
Ok to refill, 

## 2015-01-09 NOTE — Telephone Encounter (Signed)
Last OV 9.24.15, last refill 1.28.16.  Please advise refill

## 2015-01-20 ENCOUNTER — Other Ambulatory Visit: Payer: Self-pay | Admitting: Internal Medicine

## 2015-02-21 ENCOUNTER — Other Ambulatory Visit: Payer: Self-pay | Admitting: Internal Medicine

## 2015-02-23 NOTE — Telephone Encounter (Signed)
Ok to refill,  Refill sent .  I know this patient

## 2015-02-23 NOTE — Telephone Encounter (Signed)
PT was advised on need for appt due to medication protocol every 6 months for Fluoxetine. Please read mychart message.

## 2015-03-08 NOTE — Op Note (Signed)
PATIENT NAME:  Hayley Lynn, AGUADO MR#:  [id number] DATE OF BIRTH:  1961-10-02  DATE OF PROCEDURE:  02/22/2012  PREOPERATIVE DIAGNOSIS: Acute appendicitis.   POSTOPERATIVE DIAGNOSIS: Chronic appendicitis.   PROCEDURE PERFORMED: Laparoscopic appendectomy.   SURGEON: Consuela Mimes, M.D.   ANESTHESIA: General.   PROCEDURE IN DETAIL: The patient was placed supine on the operating room table and prepped and draped in the usual sterile fashion. A supraumbilical midline 1-inch incision was made and carried down through extensive subcutaneous tissue to the linea alba which was opened and a Hassan cannula was introduced amidst horizontal mattress sutures of 0 Vicryl. A 15 mmHg CO2 pneumoperitoneum was created and two additional 5 mm trocars were placed under direct visualization. The patient was noted to have adhesions from the ascending colon to the anterior abdominal wall and a very long floppy colon. All of her tissues (skin, subcutaneous tissue, fascia, and intra-abdominal organs) were quite floppy. The appendix was not readily apparent, but the tip of it was located and it was buried in scar tissue. The appendix itself was not very large or injected and there was certainly no exudate consistent with acute appendicitis. It was felt that this was most likely chronic appendicitis. Therefore, these adhesions were tediously dissected until the entire appendix was free, and the appendectomy was performed, including just a sliver of cecal base, with the Endo GIA stapling device. The appendix was placed in an Endo Catch bag and extracted from the abdomen via the supraumbilical port. The right lower quadrant was irrigated with warm normal saline and this was suctioned clear. Hemostasis was excellent. Further inspection revealed that the patient had adhesions from the terminal ileum to the vaginal cuff of the previous hysterectomy. Both ovaries appeared fairly normal. There was no abnormality within the ileum itself  and the sigmoid colon appeared fairly normal with the exception of adhesions from the hysterectomy as well. There was certainly no evidence of sigmoid diverticulitis or a thickening of the colon wall. The remainder of the colon was inspected and appeared fairly normal. The gallbladder appeared normal and the duodenum appeared normal. Thus, it was felt that the only real cause for the patient's pain was her chronic appendicitis. Pathology is of course pending. The peritoneum was then desufflated and decannulated and the linea alba was closed with a running 0 PDS suture and the previously placed Vicryls were tied to one another. All three skin sites were closed with subcuticular 5-0 Monocryl and suture strips. The patient tolerated the procedure well. There were no complications. ____________________________ Consuela Mimes, MD wfm:slb D: 02/22/2012 23:03:58 ET T: 02/23/2012 11:06:55 ET JOB#: [id number]  cc: Consuela Mimes, MD, <Dictator> Deborra Medina, MD Consuela Mimes MD ELECTRONICALLY SIGNED 02/23/2012 21:43

## 2015-03-08 NOTE — H&P (Signed)
History of Present Illness 1 1/2 day h/o abdominal pain. First periumbilical and LLQ; moved to RLQ. Associated with mild nausea and anorexia. Last meal 8 hours ago and small. No vomiting or fever.    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, corporate    [place]  Review of Systems:   Fever/Chills No    Cough No    Sputum No    Abdominal Pain Yes    Diarrhea No    Constipation No    Nausea/Vomiting Yes    SOB/DOE No    Chest Pain No    Dysuria No    Tolerating PT Yes    Tolerating Diet Yes  Nauseated   Physical Exam:   GEN well developed, well nourished, no acute distress    HEENT pink conjunctivae, PERRL, hearing intact to voice, moist oral mucosa, good dentition    NECK supple  thyroid not tender  trachea midline    RESP normal resp effort  clear BS    CARD regular rate  no murmur  no Rub    ABD positive tenderness  soft  tender RLQ; grabs my hand but no rebound or guarding    LYMPH negative neck    EXTR negative cyanosis/clubbing, negative edema    SKIN normal to palpation, No ulcers, skin turgor good    NEURO cranial nerves intact, follows commands, strength:, motor/sensory function intact    PSYCH alert, A+O to time, place, person, good insight   Routine Hem:  10-Apr-13 19:23    WBC (CBC) 9.0   RBC (CBC) 3.96   Hemoglobin (CBC) 12.6   Hematocrit (CBC) 37.4   Platelet Count (CBC) 251   MCV 95   MCH 31.9   MCHC 33.7   RDW 12.6  Routine Chem:  10-Apr-13 19:23    Glucose,  Serum 99   BUN 9   Creatinine (comp) 0.61   Sodium, Serum 137   Potassium, Serum 3.3   Chloride, Serum 100   CO2, Serum 27   Calcium (Total), Serum 8.8  Hepatic:  10-Apr-13 19:23    Bilirubin, Total 0.7   Alkaline Phosphatase 64   SGPT (ALT) 19   SGOT (AST) 23   Total Protein, Serum 8.0   Albumin, Serum 3.9  Routine Chem:  10-Apr-13 19:23    Osmolality (calc) 273   eGFR (African American) >60   eGFR (Non-African American) >60   Anion Gap 10   Lipase 426   Radiology Results: CT:    10-Apr-13 18:29, CT Abdomen and Pelvis With Contrast   CT Abdomen and Pelvis With Contrast   REASON FOR EXAM:    STAT CR [id number] pain generalized   now localizing to RLQ pri...  COMMENTS:       PROCEDURE: CT  - CT ABDOMEN / PELVIS  W  - Feb 22 2012  6:29PM     RESULT: Comparison:  07/03/2009  Technique: Multiple axial images of the abdomen and pelvis were performed   from the lung bases to the pubic symphysis, with p.o. contrast and with   80 mL of Isovue 300 intravenous contrast.    Findings:  There is a 4 mm calcified nodule in the right lower lobe, likely sequela   of old prior infection.  Minimal low-attenuation along falciform ligament likely represents focal   fatty deposition. The spleen, adrenals, pancreas, and gallbladder are   unremarkable. The kidneys enhance normally.    The small and large bowel are normal in caliber. There is mild   diverticulosis of the sigmoid colon. The patient is status post   hysterectomy. The appendix is dilated, measuring 9-10 mm in diameter.   There are are mild adjacent inflammatory changes. No discrete   extraluminal fluid collection.    No aggressive lytic or sclerotic osseous lesions are identified.    IMPRESSION:   Acute appendicitis.    This was discussed with Dr. Deborra Medina at [facility] hours 02/22/2012. The   patient will be escorted to the emergency department.          Verified By: Gregor Hams, M.D., MD      Assessment/Admission Diagnosis Early Acute Appendicitis    Plan Laparoscopic Appendectomy   Electronic Signatures: Consuela Mimes (MD)  (Signed 10-Apr-13 20:36)  Authored: CHIEF COMPLAINT and HISTORY, PAST MEDICAL/SURGIAL HISTORY, ALLERGIES, HOME MEDICATIONS, FAMILY AND SOCIAL HISTORY, REVIEW OF SYSTEMS, PHYSICAL EXAM, LABS, Radiology, ASSESSMENT AND PLAN   Last Updated: 10-Apr-13 20:36 by Consuela Mimes (MD)

## 2015-03-29 ENCOUNTER — Other Ambulatory Visit: Payer: Self-pay | Admitting: Internal Medicine

## 2015-03-30 NOTE — Telephone Encounter (Signed)
90 day supply authorized and sent   

## 2015-03-30 NOTE — Telephone Encounter (Signed)
Last OV and refill 9.14.15.  Please advise refill

## 2015-04-22 ENCOUNTER — Other Ambulatory Visit: Payer: Self-pay | Admitting: *Deleted

## 2015-04-22 NOTE — Telephone Encounter (Signed)
Last appt 07/28/14

## 2015-04-23 MED ORDER — ALPRAZOLAM 0.5 MG PO TABS: ORAL_TABLET | ORAL | Status: DC

## 2015-04-23 NOTE — Telephone Encounter (Signed)
Refill authorized,  pateint needs 6 mo nth follow up

## 2015-04-23 NOTE — Telephone Encounter (Signed)
Sent mychart on need for appt. Rx phoned into pharmacy

## 2015-05-04 ENCOUNTER — Ambulatory Visit (INDEPENDENT_AMBULATORY_CARE_PROVIDER_SITE_OTHER): Payer: BC Managed Care – PPO | Admitting: Internal Medicine

## 2015-05-04 ENCOUNTER — Encounter: Payer: Self-pay | Admitting: Internal Medicine

## 2015-05-04 VITALS — BP 102/60 | HR 64 | Temp 97.9°F | Resp 12 | Ht 63.0 in | Wt 166.5 lb

## 2015-05-04 DIAGNOSIS — Z1239 Encounter for other screening for malignant neoplasm of breast: Secondary | ICD-10-CM

## 2015-05-04 DIAGNOSIS — E663 Overweight: Secondary | ICD-10-CM

## 2015-05-04 DIAGNOSIS — F418 Other specified anxiety disorders: Secondary | ICD-10-CM | POA: Diagnosis not present

## 2015-05-04 DIAGNOSIS — F5105 Insomnia due to other mental disorder: Secondary | ICD-10-CM

## 2015-05-04 DIAGNOSIS — F419 Anxiety disorder, unspecified: Secondary | ICD-10-CM | POA: Diagnosis not present

## 2015-05-04 NOTE — Progress Notes (Signed)
Pre-visit discussion using our clinic review tool. No additional management support is needed unless otherwise documented below in the visit note.  

## 2015-05-05 DIAGNOSIS — E663 Overweight: Secondary | ICD-10-CM | POA: Insufficient documentation

## 2015-05-05 NOTE — Assessment & Plan Note (Signed)
Chronic depressive symptoms are managed with Prozac. Prior attempts to self weaning have been accompanied by recurrence of anhedonia and irritability. Continue Prozac daily.   

## 2015-05-05 NOTE — Assessment & Plan Note (Signed)
I have addressed  BMI and recommended wt loss of 10% of body weigh over the next 6 months using a low glycemic index diet and regular exercise a minimum of 5 days per week.   

## 2015-05-05 NOTE — Progress Notes (Signed)
Subjective:  Patient ID: Hayley Lynn, female    DOB: 16-Sep-1961  Age: 54 y.o. MRN: [id number]  CC: The primary encounter diagnosis was Breast cancer screening. Diagnoses of Insomnia secondary to anxiety, Depression with anxiety, Breast screening, and Overweight (BMI 25.0-29.9) were also pertinent to this visit.  HPI SOLITA MACADAM presents for medication refill and follow up on chronic insomnia secondary to depression with anxiety. Patient  Continues to have trouble sleeping.  Wakes up 2 to 3 times per night . Bedtime hygiene reviewed,  Patient has not been using an electronic book to read before bed.  Does not drink caffeinated beverages after 3 PM.  Only voids  bladder once per night.  No snoring partner. Does not drink alcohol to excess.  Not exercising excessively in the evening. Patient does have a history of anxiety but does not lie awake worrying about issues that cannot be resolved. Does not take stimulants. . She is using lunesta and alternating with alprazolam .  Home stressors include increased responsibilities caring for aging mother and mother in law,  husband's inoluntary estrangement from his daughter from first marriage.  Outpatient Prescriptions Prior to Visit  Medication Sig Dispense Refill  . ALPRAZolam (XANAX) 0.5 MG tablet take 1 tablet by mouth at bedtime if needed for anxiety 30 tablet 3  . estradiol (ESTRACE) 1 MG tablet take 1 and 1/2 tablets by mouth once daily 90 tablet 3  . eszopiclone (LUNESTA) 2 MG TABS tablet take 1 tablet by mouth at bedtime 90 tablet 3  . FLUoxetine (PROZAC) 20 MG capsule take 1 capsule by mouth once daily 30 capsule 3  . conjugated estrogens (PREMARIN) vaginal cream Place 1 Applicatorful vaginally daily. (Patient not taking: Reported on 05/04/2015) 42.5 g 12   No facility-administered medications prior to visit.    Review of Systems;  Patient denies headache, fevers, malaise, unintentional weight loss, skin rash, eye pain, sinus congestion  and sinus pain, sore throat, dysphagia,  hemoptysis , cough, dyspnea, wheezing, chest pain, palpitations, orthopnea, edema, abdominal pain, nausea, melena, diarrhea, constipation, flank pain, dysuria, hematuria, urinary  Frequency, nocturia, numbness, tingling, seizures,  Focal weakness, Loss of consciousness,  Tremor, insomnia, depression, anxiety, and suicidal ideation.      Objective:  BP 102/60 mmHg  Pulse 64  Temp(Src) 97.9 F (36.6 C) (Oral)  Resp 12  Ht 5\' 3"  (1.6 m)  Wt 166 lb 8 oz (75.524 kg)  BMI 29.50 kg/m2  SpO2 98%  BP Readings from Last 3 Encounters:  05/04/15 102/60  07/28/14 100/60  09/03/13 102/58    Wt Readings from Last 3 Encounters:  05/04/15 166 lb 8 oz (75.524 kg)  07/28/14 165 lb 12 oz (75.184 kg)  09/03/13 160 lb 8 oz (72.802 kg)    General appearance: alert, cooperative and appears stated age Ears: normal TM's and external ear canals both ears Throat: lips, mucosa, and tongue normal; teeth and gums normal Neck: no adenopathy, no carotid bruit, supple, symmetrical, trachea midline and thyroid not enlarged, symmetric, no tenderness/mass/nodules Back: symmetric, no curvature. ROM normal. No CVA tenderness. Lungs: clear to auscultation bilaterally Heart: regular rate and rhythm, S1, S2 normal, no murmur, click, rub or gallop Abdomen: soft, non-tender; bowel sounds normal; no masses,  no organomegaly Pulses: 2+ and symmetric Skin: Skin color, texture, turgor normal. No rashes or lesions Lymph nodes: Cervical, supraclavicular, and axillary nodes normal.  Lab Results  Component Value Date   HGBA1C 5.3 09/19/2013    Lab Results  Component  Value Date   CREATININE 0.7 07/28/2014   CREATININE 0.7 09/03/2013   CREATININE 0.7 02/27/2013    Lab Results  Component Value Date   WBC 6.7 07/28/2014   HGB 12.3 07/28/2014   HCT 36.6 07/28/2014   PLT 263.0 07/28/2014   GLUCOSE 81 07/28/2014   CHOL 214* 07/28/2014   TRIG 92.0 07/28/2014   HDL 57.00  07/28/2014   LDLDIRECT 115.1 02/27/2013   LDLCALC 139* 07/28/2014   ALT 9 07/28/2014   AST 22 07/28/2014   NA 138 07/28/2014   K 4.3 07/28/2014   CL 104 07/28/2014   CREATININE 0.7 07/28/2014   BUN 17 07/28/2014   CO2 28 07/28/2014   TSH 2.26 07/28/2014   INR 0.9 02/22/2012   HGBA1C 5.3 09/19/2013    Dg Chest 2 View  07/28/2014   CLINICAL DATA:  Recurrent chest pain.  EXAM: CHEST  2 VIEW  COMPARISON:  None.  FINDINGS: Cardiac silhouette is normal in size and configuration. Normal mediastinal and hilar contours. Lungs are hyperexpanded but clear. No pleural effusion. No pneumothorax.  Bony thorax is intact.  IMPRESSION: 1. No acute cardiopulmonary disease. 2. Hyperexpanded lungs, possible COPD.   Electronically Signed   By: Lajean Manes M.D.   On: 07/28/2014 10:09    Assessment & Plan:   Problem List Items Addressed This Visit    Insomnia secondary to anxiety    Managed with lunesta but only given 15/month,  Using Alprazolam for the other night . The risks and benefits of benzodiazepine use were reviewed  with patient today including excessive sedation leading to respiratory depression,  impaired thinking/driving, and addiction.  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            Breast screening    Long overdue for mammogram.  Does breast exams monthly  Mammogram to be done at [facility] facility, patient has Rx. And will arrange.       Depression with anxiety    Chronic depressive symptoms are managed with Prozac. Prior attempts to self weaning have been accompanied by recurrence of anhedonia and irritability. Continue Prozac daily.        Overweight (BMI 25.0-29.9)    I have addressed  BMI and recommended wt loss of 10% of body weigh over the next 6 months using a low glycemic index diet and regular exercise a minimum of 5 days per week.         Other Visit Diagnoses     Breast cancer screening    -  Primary    Relevant Orders    MM DIGITAL SCREENING BILATERAL       A total of 25 minutes of face to face time was spent with patient more than half of which was spent in counselling about the above mentioned conditions  and coordination of care  I am having Ms. Bovard maintain her conjugated estrogens, eszopiclone, FLUoxetine, estradiol, and ALPRAZolam.  No orders of the defined types were placed in this encounter.    There are no discontinued medications.  Follow-up: Return in about 6 months (around 11/03/2015).   Crecencio Mc, MD

## 2015-05-05 NOTE — Assessment & Plan Note (Signed)
Long overdue for mammogram.  Does breast exams monthly  Mammogram to be done at [facility] facility, patient has Rx. And will arrange.

## 2015-05-05 NOTE — Assessment & Plan Note (Signed)
Managed with lunesta but only given 15/month,  Using Alprazolam for the other night . The risks and benefits of benzodiazepine use were reviewed  with patient today including excessive sedation leading to respiratory depression,  impaired thinking/driving, and addiction.  Patient is a registered RN and  was advised to avoid concurrent use with alcohol, to use medication only as needed and not to share with others  .       

## 2015-06-24 ENCOUNTER — Other Ambulatory Visit: Payer: Self-pay | Admitting: Internal Medicine

## 2015-08-10 ENCOUNTER — Other Ambulatory Visit: Payer: Self-pay | Admitting: Internal Medicine

## 2015-08-11 NOTE — Telephone Encounter (Signed)
Pt had a my chart visit on 6.20.16, does not have and OV scheduled. Please advise refill

## 2015-08-12 NOTE — Telephone Encounter (Signed)
90 day supply authorized and sent   

## 2015-08-13 NOTE — Telephone Encounter (Signed)
Rx faxed 9.28.16 

## 2015-09-22 ENCOUNTER — Other Ambulatory Visit: Payer: Self-pay | Admitting: Internal Medicine

## 2015-10-04 ENCOUNTER — Other Ambulatory Visit: Payer: Self-pay | Admitting: Internal Medicine

## 2015-10-05 NOTE — Telephone Encounter (Signed)
Please advise 

## 2015-10-06 MED ORDER — ALPRAZOLAM 0.5 MG PO TABS: ORAL_TABLET | ORAL | Status: DC

## 2015-10-06 NOTE — Telephone Encounter (Signed)
Script faxed.

## 2015-10-06 NOTE — Telephone Encounter (Signed)
Ok to refill,  printed rx  And sent message to patient

## 2015-11-02 ENCOUNTER — Encounter: Payer: Self-pay | Admitting: Internal Medicine

## 2015-11-11 ENCOUNTER — Ambulatory Visit (INDEPENDENT_AMBULATORY_CARE_PROVIDER_SITE_OTHER): Payer: BC Managed Care – PPO | Admitting: Gastroenterology

## 2015-11-11 ENCOUNTER — Encounter: Payer: Self-pay | Admitting: Gastroenterology

## 2015-11-11 VITALS — BP 109/60 | HR 67 | Temp 98.1°F | Ht 63.0 in | Wt 169.0 lb

## 2015-11-11 DIAGNOSIS — K921 Melena: Secondary | ICD-10-CM

## 2015-11-11 MED ORDER — HYDROCORTISONE ACETATE 25 MG RE SUPP: 25.0000 mg | Freq: Two times a day (BID) | RECTAL | Status: DC

## 2015-11-11 NOTE — Progress Notes (Signed)
Primary Care Physician: Crecencio Mc, MD  Primary Gastroenterologist:  Dr. Lucilla Lame  Chief Complaint  Patient presents with  . Hematochezia    HPI: Hayley Lynn is a 54 y.o. female here for rectal bleeding. The patient reports that she has had bright red blood per rectum a few times recently. The patient reports that there is only bright red blood on the toilet paper with a few drops of bright red blood coming from her anus. There is no report of any unexplained weight loss or change in bowel habits. The patient has chronically alternating diarrhea and constipation. The patient takes sometimes as when she gets very constipated.  Current Outpatient Prescriptions  Medication Sig Dispense Refill  . ALPRAZolam (XANAX) 0.5 MG tablet take 1 tablet by mouth at bedtime if needed for anxiety 30 tablet 3  . estradiol (ESTRACE) 1 MG tablet take 1 and 1/2 tablets by mouth once daily 90 tablet 3  . eszopiclone (LUNESTA) 2 MG TABS tablet take 1 tablet by mouth at bedtime 90 tablet 0  . FLUoxetine (PROZAC) 20 MG capsule take 1 capsule by mouth once daily 30 capsule 2  . conjugated estrogens (PREMARIN) vaginal cream Place 1 Applicatorful vaginally daily. (Patient not taking: Reported on 05/04/2015) 42.5 g 12  . hydrocortisone (ANUSOL-HC) 25 MG suppository Place 1 suppository (25 mg total) rectally 2 (two) times daily. 12 suppository 1   No current facility-administered medications for this visit.    Allergies as of 11/11/2015 - Review Complete 11/11/2015  Allergen Reaction Noted  . Sulfa antibiotics Rash 09/05/2012    ROS:  General: Negative for anorexia, weight loss, fever, chills, fatigue, weakness. ENT: Negative for hoarseness, difficulty swallowing , nasal congestion. CV: Negative for chest pain, angina, palpitations, dyspnea on exertion, peripheral edema.  Respiratory: Negative for dyspnea at rest, dyspnea on exertion, cough, sputum, wheezing.  GI: See history of present  illness. GU:  Negative for dysuria, hematuria, urinary incontinence, urinary frequency, nocturnal urination.  Endo: Negative for unusual weight change.    Physical Examination:   BP 109/60 mmHg  Pulse 67  Temp(Src) 98.1 F (36.7 C) (Oral)  Ht 5\' 3"  (1.6 m)  Wt 169 lb (76.658 kg)  BMI 29.94 kg/m2  General: Well-nourished, well-developed in no acute distress.  Eyes: No icterus. Conjunctivae pink. Mouth: Oropharyngeal mucosa moist and pink , no lesions erythema or exudate. Lungs: Clear to auscultation bilaterally. Non-labored. Heart: Regular rate and rhythm, no murmurs rubs or gallops.  Abdomen: Bowel sounds are normal, nontender, nondistended, no hepatosplenomegaly or masses, no abdominal bruits or hernia , no rebound or guarding.   Extremities: No lower extremity edema. No clubbing or deformities. Neuro: Alert and oriented x 3.  Grossly intact. Skin: Warm and dry, no jaundice.   Psych: Alert and cooperative, normal mood and affect.  Labs:    Imaging Studies: No results found.  Assessment and Plan:   Hayley Lynn is a 54 y.o. y/o female  who has a history of alternating diarrhea and constipation who now comes in with rectal bleeding. The patient has had a colonoscopy back in 2009 in 2013. The patient has a history of colon polyps. The patient is due for colonoscopy next year. The patient would like to wait for her colonoscopy until next year and will be started on hydrocortisone 25 percent rectal suppositories. If the bleeding does not get better the patient has been told to contact me. The patient will be called for her next colonoscopy in 1  year's time.  Note: This dictation was prepared with Dragon dictation along with smaller phrase technology. Any transcriptional errors that result from this process are unintentional.

## 2015-11-29 ENCOUNTER — Other Ambulatory Visit: Payer: Self-pay | Admitting: Internal Medicine

## 2015-12-20 ENCOUNTER — Other Ambulatory Visit: Payer: Self-pay | Admitting: Internal Medicine

## 2016-01-22 ENCOUNTER — Other Ambulatory Visit: Payer: Self-pay | Admitting: Internal Medicine

## 2016-01-22 ENCOUNTER — Encounter: Payer: Self-pay | Admitting: Internal Medicine

## 2016-01-22 MED ORDER — OSELTAMIVIR PHOSPHATE 75 MG PO CAPS: 75.0000 mg | ORAL_CAPSULE | Freq: Two times a day (BID) | ORAL | Status: DC

## 2016-02-15 ENCOUNTER — Other Ambulatory Visit: Payer: Self-pay

## 2016-02-15 MED ORDER — ALPRAZOLAM 0.5 MG PO TABS: ORAL_TABLET | ORAL | Status: DC

## 2016-02-24 ENCOUNTER — Ambulatory Visit: Payer: BC Managed Care – PPO | Admitting: Internal Medicine

## 2016-03-23 ENCOUNTER — Other Ambulatory Visit: Payer: Self-pay | Admitting: Internal Medicine

## 2016-03-24 NOTE — Telephone Encounter (Signed)
Last OV was 04/2015.  Please advise a refill?

## 2016-03-24 NOTE — Telephone Encounter (Signed)
Filled,  Friend of family ,  Saw her recently and she has an appt coming up

## 2016-03-25 ENCOUNTER — Encounter: Payer: Self-pay | Admitting: Internal Medicine

## 2016-03-25 ENCOUNTER — Ambulatory Visit (INDEPENDENT_AMBULATORY_CARE_PROVIDER_SITE_OTHER): Payer: BC Managed Care – PPO | Admitting: Internal Medicine

## 2016-03-25 VITALS — BP 100/60 | HR 88 | Ht 63.0 in | Wt 165.4 lb

## 2016-03-25 DIAGNOSIS — IMO0002 Reserved for concepts with insufficient information to code with codable children: Secondary | ICD-10-CM

## 2016-03-25 DIAGNOSIS — K5909 Other constipation: Secondary | ICD-10-CM

## 2016-03-25 DIAGNOSIS — E559 Vitamin D deficiency, unspecified: Secondary | ICD-10-CM

## 2016-03-25 DIAGNOSIS — Z7289 Other problems related to lifestyle: Secondary | ICD-10-CM | POA: Diagnosis not present

## 2016-03-25 DIAGNOSIS — N8110 Cystocele, unspecified: Secondary | ICD-10-CM | POA: Diagnosis not present

## 2016-03-25 DIAGNOSIS — E785 Hyperlipidemia, unspecified: Secondary | ICD-10-CM

## 2016-03-25 DIAGNOSIS — E663 Overweight: Secondary | ICD-10-CM

## 2016-03-25 DIAGNOSIS — R5383 Other fatigue: Secondary | ICD-10-CM | POA: Diagnosis not present

## 2016-03-25 DIAGNOSIS — K5900 Constipation, unspecified: Secondary | ICD-10-CM

## 2016-03-25 LAB — CBC WITH DIFFERENTIAL/PLATELET
BASOS ABS: 0 {cells}/uL (ref 0–200)
Basophils Relative: 0 %
EOS ABS: 74 {cells}/uL (ref 15–500)
EOS PCT: 1 %
HEMATOCRIT: 37.9 % (ref 35.0–45.0)
HEMOGLOBIN: 12.5 g/dL (ref 11.7–15.5)
LYMPHS ABS: 2146 {cells}/uL (ref 850–3900)
Lymphocytes Relative: 29 %
MCH: 30.9 pg (ref 27.0–33.0)
MCHC: 33 g/dL (ref 32.0–36.0)
MCV: 93.8 fL (ref 80.0–100.0)
MONO ABS: 592 {cells}/uL (ref 200–950)
MPV: 10.1 fL (ref 7.5–12.5)
Monocytes Relative: 8 %
NEUTROS ABS: 4588 {cells}/uL (ref 1500–7800)
Neutrophils Relative %: 62 %
Platelets: 321 10*3/uL (ref 140–400)
RBC: 4.04 MIL/uL (ref 3.80–5.10)
RDW: 12.9 % (ref 11.0–15.0)
WBC: 7.4 10*3/uL (ref 3.8–10.8)

## 2016-03-25 LAB — LIPID PANEL
CHOL/HDL RATIO: 2.5 ratio (ref <=5.0)
CHOLESTEROL: 212 mg/dL — AB (ref 125–200)
HDL: 86 mg/dL (ref >=46)
LDL Cholesterol: 114 mg/dL (ref <130)
TRIGLYCERIDES: 59 mg/dL (ref <150)
VLDL: 12 mg/dL (ref <30)

## 2016-03-25 LAB — COMPREHENSIVE METABOLIC PANEL
ALT: 10 U/L (ref 6–29)
AST: 18 U/L (ref 10–35)
Albumin: 4 g/dL (ref 3.6–5.1)
Alkaline Phosphatase: 56 U/L (ref 33–130)
BILIRUBIN TOTAL: 0.4 mg/dL (ref 0.2–1.2)
BUN: 13 mg/dL (ref 7–25)
CHLORIDE: 104 mmol/L (ref 98–110)
CO2: 29 mmol/L (ref 20–31)
CREATININE: 0.89 mg/dL (ref 0.50–1.05)
Calcium: 9.2 mg/dL (ref 8.6–10.4)
GLUCOSE: 103 mg/dL — AB (ref 65–99)
Potassium: 3.9 mmol/L (ref 3.5–5.3)
SODIUM: 140 mmol/L (ref 135–146)
Total Protein: 6.8 g/dL (ref 6.1–8.1)

## 2016-03-25 LAB — LDL CHOLESTEROL, DIRECT: Direct LDL: 117 mg/dL (ref <130)

## 2016-03-25 LAB — TSH: TSH: 3.45 m[IU]/L

## 2016-03-25 MED ORDER — VALACYCLOVIR HCL 1 G PO TABS: 1000.0000 mg | ORAL_TABLET | Freq: Three times a day (TID) | ORAL | Status: DC

## 2016-03-25 MED ORDER — PHENTERMINE HCL 37.5 MG PO TABS: ORAL_TABLET | ORAL | Status: DC

## 2016-03-25 NOTE — Patient Instructions (Signed)
   I have authorized the use of phentermine for 3 months.  Please have your vital signs checked a week after starting to make sure it is not affecting them .   take  1/2 tablet in the morning,  The second half by 2 PM to avoid insomnia If you have not lost 5% of your starting weight by the end of the  3 months  (8 lbs )  we will re think our plan.

## 2016-03-26 LAB — HEPATITIS C ANTIBODY: HCV Ab: NEGATIVE

## 2016-03-26 LAB — HIV ANTIBODY (ROUTINE TESTING W REFLEX): HIV: NONREACTIVE

## 2016-03-26 LAB — VITAMIN D 25 HYDROXY (VIT D DEFICIENCY, FRACTURES): VIT D 25 HYDROXY: 37 ng/mL (ref 30–100)

## 2016-03-27 ENCOUNTER — Encounter: Payer: Self-pay | Admitting: Internal Medicine

## 2016-03-27 DIAGNOSIS — E663 Overweight: Secondary | ICD-10-CM | POA: Insufficient documentation

## 2016-03-27 NOTE — Assessment & Plan Note (Addendum)
Recommend referral to Hayley Lynn for evaluation and treatment options. She has deferred for now because the surgery would require 6 weeks of short term disability , per [facility] fellow.

## 2016-03-27 NOTE — Assessment & Plan Note (Signed)
She has had difficulty losing weight due to increased appetite and is requesting a trial of  Phentermine.  She is aware of the possible side effects and risks and understands that    The medication will be discontinued if she has not lost 5% of her body weight over the next 3 months, which , based on today's weight is 8 lbs.

## 2016-03-27 NOTE — Assessment & Plan Note (Signed)
Managed with [facility].  History of diverticulitis.

## 2016-03-27 NOTE — Progress Notes (Signed)
Subjective:  Patient ID: Hayley Lynn, female    DOB: Apr 06, 1961  Age: 55 y.o. MRN: [id number]  CC: The primary encounter diagnosis was Cystocele. Diagnoses of Other fatigue, Vitamin D deficiency, Other problems related to lifestyle, Hyperlipidemia, Overweight, Overweight (BMI 25.0-29.9), and Chronic constipation were also pertinent to this visit.  HPI  Hayley Lynn presents for follow up on chronic conditions. She has gained weight and is having trouble controlling her appetite.  She has started an exercise program 3-4 times per week /  Worsening cystocele. "i feel like I have a baseball between my legs." She denies pain ,bu her husband is afraid to have intercourse with her.   Outpatient Prescriptions Prior to Visit  Medication Sig Dispense Refill  . ALPRAZolam (XANAX) 0.5 MG tablet take 1 tablet by mouth at bedtime if needed for anxiety 30 tablet 3  . conjugated estrogens (PREMARIN) vaginal cream Place 1 Applicatorful vaginally daily. 42.5 g 12  . estradiol (ESTRACE) 1 MG tablet take 1 and 1/2 tablets by mouth once daily 90 tablet 3  . eszopiclone (LUNESTA) 2 MG TABS tablet take 1 tablet by mouth at bedtime 90 tablet 0  . FLUoxetine (PROZAC) 20 MG capsule take 1 capsule by mouth once daily 90 capsule 1  . hydrocortisone (ANUSOL-HC) 25 MG suppository Place 1 suppository (25 mg total) rectally 2 (two) times daily. 12 suppository 1  . oseltamivir (TAMIFLU) 75 MG capsule Take 1 capsule (75 mg total) by mouth 2 (two) times daily. (Patient not taking: Reported on 03/25/2016) 10 capsule 0   No facility-administered medications prior to visit.    Review of Systems;  Patient denies headache, fevers, malaise, unintentional weight loss, skin rash, eye pain, sinus congestion and sinus pain, sore throat, dysphagia,  hemoptysis , cough, dyspnea, wheezing, chest pain, palpitations, orthopnea, edema, abdominal pain, nausea, melena, diarrhea, constipation, flank pain, dysuria, hematuria, urinary   Frequency, nocturia, numbness, tingling, seizures,  Focal weakness, Loss of consciousness,  Tremor, insomnia, depression, anxiety, and suicidal ideation.      Objective:  BP 100/60 mmHg  Pulse 88  Ht 5\' 3"  (1.6 m)  Wt 165 lb 6.4 oz (75.025 kg)  BMI 29.31 kg/m2  BP Readings from Last 3 Encounters:  03/25/16 100/60  11/11/15 109/60  05/04/15 102/60    Wt Readings from Last 3 Encounters:  03/25/16 165 lb 6.4 oz (75.025 kg)  11/11/15 169 lb (76.658 kg)  05/04/15 166 lb 8 oz (75.524 kg)    General appearance: alert, cooperative and appears stated age Ears: normal TM's and external ear canals both ears Throat: lips, mucosa, and tongue normal; teeth and gums normal Neck: no adenopathy, no carotid bruit, supple, symmetrical, trachea midline and thyroid not enlarged, symmetric, no tenderness/mass/nodules Back: symmetric, no curvature. ROM normal. No CVA tenderness. Lungs: clear to auscultation bilaterally Heart: regular rate and rhythm, S1, S2 normal, no murmur, click, rub or gallop Abdomen: soft, non-tender; bowel sounds normal; no masses,  no organomegaly Pulses: 2+ and symmetric Skin: Skin color, texture, turgor normal. No rashes or lesions Lymph nodes: Cervical, supraclavicular, and axillary nodes normal.  Lab Results  Component Value Date   HGBA1C 5.3 09/19/2013    Lab Results  Component Value Date   CREATININE 0.89 03/25/2016   CREATININE 0.7 07/28/2014   CREATININE 0.7 09/03/2013    Lab Results  Component Value Date   WBC 7.4 03/25/2016   HGB 12.5 03/25/2016   HCT 37.9 03/25/2016   PLT 321 03/25/2016   GLUCOSE 103*  03/25/2016   CHOL 212* 03/25/2016   TRIG 59 03/25/2016   HDL 86 03/25/2016   LDLDIRECT 117 03/25/2016   LDLCALC 114 03/25/2016   ALT 10 03/25/2016   AST 18 03/25/2016   NA 140 03/25/2016   K 3.9 03/25/2016   CL 104 03/25/2016   CREATININE 0.89 03/25/2016   BUN 13 03/25/2016   CO2 29 03/25/2016   TSH 3.45 03/25/2016   INR 0.9 02/22/2012    HGBA1C 5.3 09/19/2013    Dg Chest 2 View  07/28/2014  CLINICAL DATA:  Recurrent chest pain. EXAM: CHEST  2 VIEW COMPARISON:  None. FINDINGS: Cardiac silhouette is normal in size and configuration. Normal mediastinal and hilar contours. Lungs are hyperexpanded but clear. No pleural effusion. No pneumothorax. Bony thorax is intact. IMPRESSION: 1. No acute cardiopulmonary disease. 2. Hyperexpanded lungs, possible COPD. Electronically Signed   By: Lajean Manes M.D.   On: 07/28/2014 10:09    Assessment & Plan:   Problem List Items Addressed This Visit    Chronic constipation    Managed with [facility].  History of diverticulitis.         Overweight (BMI 25.0-29.9)    She has had difficulty losing weight due to increased appetite and is requesting a trial of  Phentermine.  She is aware of the possible side effects and risks and understands that    The medication will be discontinued if she has not lost 5% of her body weight over the next 3 months, which , based on today's weight is 8 lbs.      Cystocele - Primary    Recommend referral to Bl[facility] Rich for evaluation and treatment options. She has deferred for now because the surgery would require 6 weeks of short term disability , per [facility] fellow.         Relevant Orders   Ambulatory referral to Urogynecology   RESOLVED: Overweight    Other Visit Diagnoses    Other fatigue        Relevant Orders    Comprehensive metabolic panel (Completed)    TSH (Completed)    CBC with Differential/Platelet (Completed)    Vitamin D deficiency        Relevant Orders    VITAMIN D 25 Hydroxy (Vit-D Deficiency, Fractures) (Completed)    Other problems related to lifestyle        Relevant Orders    Hepatitis C antibody (Completed)    HIV antibody (Completed)    Hyperlipidemia        Relevant Orders    Lipid panel (Completed)    LDL cholesterol, direct (Completed)       I have discontinued Ms. Mcginnity's oseltamivir. I am also having her start on  valACYclovir and phentermine. Additionally, I am having her maintain her conjugated estrogens, eszopiclone, hydrocortisone, estradiol, ALPRAZolam, and FLUoxetine.  Meds ordered this encounter  Medications  . valACYclovir (VALTREX) 1000 MG tablet    Sig: Take 1 tablet (1,000 mg total) by mouth 3 (three) times daily.    Dispense:  21 tablet    Refill:  3  . phentermine (ADIPEX-P) 37.5 MG tablet    Sig: 1/2 tablet in the am and early afternoon    Dispense:  30 tablet    Refill:  2    Medications Discontinued During This Encounter  Medication Reason  . oseltamivir (TAMIFLU) 75 MG capsule Completed Course    Follow-up: No Follow-up on file.   Crecencio Mc, MD

## 2016-03-30 ENCOUNTER — Encounter: Payer: Self-pay | Admitting: Internal Medicine

## 2016-05-24 ENCOUNTER — Ambulatory Visit
Admission: RE | Admit: 2016-05-24 | Discharge: 2016-05-24 | Disposition: A | Discharge status: Home / Self Care | Payer: BC Managed Care – PPO | Source: Ambulatory Visit | Attending: Internal Medicine | Admitting: Internal Medicine

## 2016-05-24 ENCOUNTER — Other Ambulatory Visit: Payer: Self-pay | Admitting: Internal Medicine

## 2016-05-24 DIAGNOSIS — Z1231 Encounter for screening mammogram for malignant neoplasm of breast: Secondary | ICD-10-CM | POA: Insufficient documentation

## 2016-05-24 DIAGNOSIS — Z1239 Encounter for other screening for malignant neoplasm of breast: Secondary | ICD-10-CM

## 2016-06-19 ENCOUNTER — Other Ambulatory Visit: Payer: Self-pay | Admitting: Internal Medicine

## 2016-06-20 NOTE — Telephone Encounter (Signed)
Please review refill request for Lunesta. Thank you-aa

## 2016-06-21 NOTE — Telephone Encounter (Signed)
RX called in to the pharmacy-aa 

## 2016-06-21 NOTE — Telephone Encounter (Signed)
Ok to refill,  Authorized in epic 

## 2016-07-27 ENCOUNTER — Other Ambulatory Visit: Payer: Self-pay | Admitting: Internal Medicine

## 2016-07-28 NOTE — Telephone Encounter (Signed)
Can refill through November.  6 month follow up needed for alprazolam in Dec

## 2016-07-28 NOTE — Telephone Encounter (Signed)
Rx refill Xanax 0.5 mg  Last refill 02/15/2016 Last Ov: 5/12/201 Last Labs: 03/25/2016 Please advise

## 2016-07-29 NOTE — Telephone Encounter (Signed)
rx sent to [facility]

## 2016-09-13 ENCOUNTER — Ambulatory Visit (INDEPENDENT_AMBULATORY_CARE_PROVIDER_SITE_OTHER): Payer: BC Managed Care – PPO | Admitting: Family

## 2016-09-13 ENCOUNTER — Encounter: Payer: Self-pay | Admitting: Family

## 2016-09-13 VITALS — BP 110/70 | HR 82 | Wt 162.0 lb

## 2016-09-13 DIAGNOSIS — N3001 Acute cystitis with hematuria: Secondary | ICD-10-CM | POA: Diagnosis not present

## 2016-09-13 LAB — POCT URINALYSIS DIPSTICK
Glucose, UA: NEGATIVE
NITRITE UA: POSITIVE
Protein, UA: 100
Spec Grav, UA: 1.025
Urobilinogen, UA: 0.2
pH, UA: 5

## 2016-09-13 MED ORDER — CEFDINIR 300 MG PO CAPS: 300.0000 mg | ORAL_CAPSULE | Freq: Two times a day (BID) | ORAL | 0 refills | Status: AC

## 2016-09-13 NOTE — Progress Notes (Signed)
Subjective:    Patient ID: Hayley Lynn, female    DOB: 08/12/1961, 55 y.o.   MRN: [id number]  CC: Hayley Lynn is a 55 y.o. female who presents today for an acute visit.    HPI: Patient presents for acute visit with chief complaint of cloudy urine over the past week. Mild midline low back pain however has had that since surgery. No fever. No history of recurrent UTIs with concern of STDs. No renal stones.  10/13 had repair of cysctole and had to self cath after surgery.     HISTORY:  Past Medical History:  Diagnosis Date  . Blood in stool   . Chicken pox   . Depression   . Diverticulitis    Past Surgical History:  Procedure Laterality Date  . ABDOMINAL HYSTERECTOMY    . APPENDECTOMY     Family History  Problem Relation Age of Onset  . Alcohol abuse Mother   . Arthritis Mother   . Hyperlipidemia Mother   . Stroke Mother   . Hypertension Mother   . Arthritis Father   . Hyperlipidemia Father   . Heart disease Father   . Arthritis Paternal Grandmother   . Hypertension Paternal Grandmother   . Arthritis Paternal Grandfather   . Hypertension Paternal Grandfather   . Stroke Paternal Grandfather   . Diabetes Brother     Allergies: Sulfa antibiotics Current Outpatient Prescriptions on File Prior to Visit  Medication Sig Dispense Refill  . ALPRAZolam (XANAX) 0.5 MG tablet take 1 tablet by mouth at bedtime if needed for anxiety 30 tablet 3  . conjugated estrogens (PREMARIN) vaginal cream Place 1 Applicatorful vaginally daily. 42.5 g 12  . estradiol (ESTRACE) 1 MG tablet take 1 and 1/2 tablets by mouth once daily 90 tablet 3  . eszopiclone (LUNESTA) 2 MG TABS tablet take 1 tablet by mouth at bedtime 90 tablet 1  . FLUoxetine (PROZAC) 20 MG capsule take 1 capsule by mouth once daily 90 capsule 1  . hydrocortisone (ANUSOL-HC) 25 MG suppository Place 1 suppository (25 mg total) rectally 2 (two) times daily. 12 suppository 1  . phentermine (ADIPEX-P) 37.5 MG tablet 1/2  tablet in the am and early afternoon 30 tablet 2  . valACYclovir (VALTREX) 1000 MG tablet Take 1 tablet (1,000 mg total) by mouth 3 (three) times daily. 21 tablet 3   No current facility-administered medications on file prior to visit.     Social History  Substance Use Topics  . Smoking status: Never Smoker  . Smokeless tobacco: Never Used  . Alcohol use Yes    Review of Systems  Constitutional: Negative for chills and fever.  Respiratory: Negative for cough.   Cardiovascular: Negative for chest pain and palpitations.  Gastrointestinal: Negative for nausea and vomiting.      Objective:    BP 110/70   Pulse 82   Wt 162 lb (73.5 kg)   SpO2 97%   BMI 28.70 kg/m    Physical Exam  Constitutional: She appears well-developed and well-nourished.  Cardiovascular: Normal rate, regular rhythm, normal heart sounds and normal pulses.   Pulmonary/Chest: Effort normal and breath sounds normal. She has no wheezes. She has no rhonchi. She has no rales.  Abdominal: There is no CVA tenderness.  Neurological: She is alert.  Skin: Skin is warm and dry.  Psychiatric: She has a normal mood and affect. Her speech is normal and behavior is normal. Thought content normal.  Vitals reviewed.  Assessment & Plan:  1. Acute cystitis with hematuria UA positive for nitrites, leukocytes, and blood. Will treat empirically for UTI. Pending culture. - POCT Urinalysis Dipstick - Urine culture - cefdinir (OMNICEF) 300 MG capsule; Take 1 capsule (300 mg total) by mouth 2 (two) times daily.  Dispense: 14 capsule; Refill: 0    I am having Ms. Sesma maintain her conjugated estrogens, hydrocortisone, FLUoxetine, valACYclovir, phentermine, eszopiclone, estradiol, and ALPRAZolam.   No orders of the defined types were placed in this encounter.   Return precautions given.   Risks, benefits, and alternatives of the medications and treatment plan prescribed today were discussed, and patient expressed  understanding.   Education regarding symptom management and diagnosis given to patient on AVS.  Continue to follow with TULLO, Aris Everts, MD for routine health maintenance.   Linward Headland and I agreed with plan.   Mable Paris, FNP

## 2016-09-13 NOTE — Patient Instructions (Signed)
Drink plenty of water and take antibiotic as prescribed.   We are pending the urine culture to know the organism causing the infection and our office will call you with results. If the particular organism requires a different antibiotic than the on prescribed, we will place an order for a new prescription at that time.   If you symptoms worsen or you have new symptoms, please contact our office, or return to clinic for re evaluation.  Urinary Tract Infection Urinary tract infections (UTIs) can develop anywhere along your urinary tract. Your urinary tract is your body's drainage system for removing wastes and extra water. Your urinary tract includes two kidneys, two ureters, a bladder, and a urethra. Your kidneys are a pair of bean-shaped organs. Each kidney is about the size of your fist. They are located below your ribs, one on each side of your spine. CAUSES Infections are caused by microbes, which are microscopic organisms, including fungi, viruses, and bacteria. These organisms are so small that they can only be seen through a microscope. Bacteria are the microbes that most commonly cause UTIs. SYMPTOMS  Symptoms of UTIs may vary by age and gender of the patient and by the location of the infection. Symptoms in young women typically include a frequent and intense urge to urinate and a painful, burning feeling in the bladder or urethra during urination. Older women and men are more likely to be tired, shaky, and weak and have muscle aches and abdominal pain. A fever may mean the infection is in your kidneys. Other symptoms of a kidney infection include pain in your back or sides below the ribs, nausea, and vomiting. DIAGNOSIS To diagnose a UTI, your caregiver will ask you about your symptoms. Your caregiver will also ask you to provide a urine sample. The urine sample will be tested for bacteria and white blood cells. White blood cells are made by your body to help fight infection. TREATMENT    Typically, UTIs can be treated with medication. Because most UTIs are caused by a bacterial infection, they usually can be treated with the use of antibiotics. The choice of antibiotic and length of treatment depend on your symptoms and the type of bacteria causing your infection. HOME CARE INSTRUCTIONS  If you were prescribed antibiotics, take them exactly as your caregiver instructs you. Finish the medication even if you feel better after you have only taken some of the medication.  Drink enough water and fluids to keep your urine clear or pale yellow.  Avoid caffeine, tea, and carbonated beverages. They tend to irritate your bladder.  Empty your bladder often. Avoid holding urine for long periods of time.  Empty your bladder before and after sexual intercourse.  After a bowel movement, women should cleanse from front to back. Use each tissue only once. SEEK MEDICAL CARE IF:   You have back pain.  You develop a fever.  Your symptoms do not begin to resolve within 3 days. SEEK IMMEDIATE MEDICAL CARE IF:   You have severe back pain or lower abdominal pain.  You develop chills.  You have nausea or vomiting.  You have continued burning or discomfort with urination. MAKE SURE YOU:   Understand these instructions.  Will watch your condition.  Will get help right away if you are not doing well or get worse.   This information is not intended to replace advice given to you by your health care provider. Make sure you discuss any questions you have with your health care   provider.   Document Released: 08/10/2005 Document Revised: 07/22/2015 Document Reviewed: 12/09/2011 Elsevier Interactive Patient Education 2016 Elsevier Inc.    

## 2016-09-15 LAB — URINE CULTURE

## 2016-09-16 ENCOUNTER — Encounter: Payer: Self-pay | Admitting: Family

## 2016-09-16 ENCOUNTER — Telehealth: Payer: Self-pay | Admitting: Family

## 2016-09-16 DIAGNOSIS — N3000 Acute cystitis without hematuria: Secondary | ICD-10-CM

## 2016-09-16 MED ORDER — CIPROFLOXACIN HCL 250 MG PO TABS
250.0000 mg | ORAL_TABLET | Freq: Two times a day (BID) | ORAL | 0 refills | Status: DC
Start: 2016-09-16 — End: 2016-09-17

## 2016-09-16 NOTE — Telephone Encounter (Signed)
See my chart message

## 2016-09-17 ENCOUNTER — Other Ambulatory Visit: Payer: Self-pay | Admitting: Family

## 2016-09-17 MED ORDER — CIPROFLOXACIN HCL 250 MG PO TABS: 250.0000 mg | ORAL_TABLET | Freq: Two times a day (BID) | ORAL | 0 refills | Status: AC

## 2016-09-22 ENCOUNTER — Other Ambulatory Visit: Payer: Self-pay | Admitting: Internal Medicine

## 2016-09-23 ENCOUNTER — Other Ambulatory Visit: Payer: Self-pay

## 2016-09-23 DIAGNOSIS — K921 Melena: Secondary | ICD-10-CM

## 2016-09-23 DIAGNOSIS — F418 Other specified anxiety disorders: Secondary | ICD-10-CM

## 2016-09-23 NOTE — Telephone Encounter (Signed)
No office visit scheduled Last office visit 03/25/16

## 2016-11-17 ENCOUNTER — Other Ambulatory Visit: Payer: Self-pay | Admitting: Internal Medicine

## 2016-11-17 ENCOUNTER — Encounter: Payer: Self-pay | Admitting: Internal Medicine

## 2016-11-17 MED ORDER — PHENTERMINE HCL 37.5 MG PO TABS: ORAL_TABLET | ORAL | 2 refills | Status: DC

## 2016-11-17 NOTE — Progress Notes (Signed)
Rx faxed to [facility] pharmacy.  

## 2016-12-13 ENCOUNTER — Other Ambulatory Visit: Payer: Self-pay

## 2016-12-13 ENCOUNTER — Telehealth: Payer: Self-pay

## 2016-12-13 NOTE — Telephone Encounter (Signed)
Colonoscopy scheduled at [facility] with [place] on 12/30/16. Please precert for hx of colon polyps Z86.010.

## 2016-12-13 NOTE — Telephone Encounter (Signed)
Gastroenterology Pre-Procedure Review  Request Date:  Requesting Physician: Dr.   PATIENT REVIEW QUESTIONS: The patient responded to the following health history questions as indicated:    1. Are you having any GI issues? no 2. Do you have a personal history of Polyps? no 3. Do you have a family history of Colon Cancer or Polyps? no 4. Diabetes Mellitus? no 5. Joint replacements in the past 12 months?no 6. Major health problems in the past 3 months?no 7. Any artificial heart valves, MVP, or defibrillator?no    MEDICATIONS & ALLERGIES:    Patient reports the following regarding taking any anticoagulation/antiplatelet therapy:   Plavix, Coumadin, Eliquis, Xarelto, Lovenox, Pradaxa, Brilinta, or Effient? no Aspirin? no  Patient confirms/reports the following medications:  Current Outpatient Prescriptions  Medication Sig Dispense Refill  . ALPRAZolam (XANAX) 0.5 MG tablet take 1 tablet by mouth at bedtime if needed for anxiety 30 tablet 3  . conjugated estrogens (PREMARIN) vaginal cream Place 1 Applicatorful vaginally daily. 42.5 g 12  . estradiol (ESTRACE) 1 MG tablet take 1 and 1/2 tablets by mouth once daily 90 tablet 3  . eszopiclone (LUNESTA) 2 MG TABS tablet take 1 tablet by mouth at bedtime 90 tablet 1  . FLUoxetine (PROZAC) 20 MG capsule take 1 capsule by mouth once daily 90 capsule 0  . hydrocortisone (ANUSOL-HC) 25 MG suppository Place 1 suppository (25 mg total) rectally 2 (two) times daily. 12 suppository 1  . phentermine (ADIPEX-P) 37.5 MG tablet 1/2 tablet in the am and early afternoon 30 tablet 2  . valACYclovir (VALTREX) 1000 MG tablet Take 1 tablet (1,000 mg total) by mouth 3 (three) times daily. 21 tablet 3   No current facility-administered medications for this visit.     Patient confirms/reports the following allergies:  Allergies  Allergen Reactions  . Sulfa Antibiotics Rash    No orders of the defined types were placed in this encounter.   AUTHORIZATION  INFORMATION Primary Insurance: 1D#: Group #:  Secondary Insurance: 1D#: Group #:  SCHEDULE INFORMATION: Date: 12/30/16 Time: Location: [facility]

## 2016-12-23 ENCOUNTER — Other Ambulatory Visit: Payer: Self-pay | Admitting: Internal Medicine

## 2016-12-27 ENCOUNTER — Encounter: Payer: Self-pay | Admitting: *Deleted

## 2016-12-29 NOTE — Discharge Instructions (Signed)

## 2016-12-30 ENCOUNTER — Encounter
Admission: RE | Disposition: A | Discharge status: Home / Self Care | Payer: Self-pay | Source: Ambulatory Visit | Attending: Gastroenterology

## 2016-12-30 ENCOUNTER — Ambulatory Visit: Payer: BC Managed Care – PPO | Admitting: Anesthesiology

## 2016-12-30 ENCOUNTER — Ambulatory Visit
Admission: RE | Admit: 2016-12-30 | Discharge: 2016-12-30 | Disposition: A | Discharge status: Home / Self Care | Payer: BC Managed Care – PPO | Source: Ambulatory Visit | Attending: Gastroenterology | Admitting: Gastroenterology

## 2016-12-30 DIAGNOSIS — Z1211 Encounter for screening for malignant neoplasm of colon: Secondary | ICD-10-CM | POA: Diagnosis not present

## 2016-12-30 DIAGNOSIS — Z86010 Personal history of colonic polyps: Secondary | ICD-10-CM | POA: Insufficient documentation

## 2016-12-30 DIAGNOSIS — M1990 Unspecified osteoarthritis, unspecified site: Secondary | ICD-10-CM | POA: Insufficient documentation

## 2016-12-30 DIAGNOSIS — K5730 Diverticulosis of large intestine without perforation or abscess without bleeding: Secondary | ICD-10-CM | POA: Diagnosis not present

## 2016-12-30 DIAGNOSIS — F419 Anxiety disorder, unspecified: Secondary | ICD-10-CM | POA: Insufficient documentation

## 2016-12-30 DIAGNOSIS — F329 Major depressive disorder, single episode, unspecified: Secondary | ICD-10-CM | POA: Diagnosis not present

## 2016-12-30 DIAGNOSIS — K648 Other hemorrhoids: Secondary | ICD-10-CM | POA: Diagnosis not present

## 2016-12-30 DIAGNOSIS — Z90710 Acquired absence of both cervix and uterus: Secondary | ICD-10-CM | POA: Insufficient documentation

## 2016-12-30 DIAGNOSIS — Z881 Allergy status to other antibiotic agents status: Secondary | ICD-10-CM | POA: Insufficient documentation

## 2016-12-30 HISTORY — DX: Unspecified osteoarthritis, unspecified site: M19.90

## 2016-12-30 HISTORY — DX: Other specified postprocedural states: Z98.890

## 2016-12-30 HISTORY — DX: Presence of spectacles and contact lenses: Z97.3

## 2016-12-30 HISTORY — DX: Headache, unspecified: R51.9

## 2016-12-30 HISTORY — DX: Other specified postprocedural states: R11.2

## 2016-12-30 HISTORY — PX: COLONOSCOPY WITH PROPOFOL: SHX5780

## 2016-12-30 HISTORY — DX: Headache: R51

## 2016-12-30 SURGERY — COLONOSCOPY WITH PROPOFOL
Anesthesia: Monitor Anesthesia Care | Wound class: Contaminated

## 2016-12-30 MED ORDER — LIDOCAINE HCL (CARDIAC) 20 MG/ML IV SOLN
INTRAVENOUS | Status: DC | PRN
  Administered 2016-12-30: 40 mg via INTRAVENOUS

## 2016-12-30 MED ORDER — LACTATED RINGERS IV SOLN
INTRAVENOUS | Status: DC
  Administered 2016-12-30: 08:00:00 via INTRAVENOUS

## 2016-12-30 MED ORDER — PROPOFOL 10 MG/ML IV BOLUS
INTRAVENOUS | Status: DC | PRN
  Administered 2016-12-30: 50 mg via INTRAVENOUS
  Administered 2016-12-30: 100 mg via INTRAVENOUS
  Administered 2016-12-30 (×3): 50 mg via INTRAVENOUS

## 2016-12-30 MED ORDER — STERILE WATER FOR IRRIGATION IR SOLN
Status: DC | PRN
  Administered 2016-12-30: 09:00:00

## 2016-12-30 SURGICAL SUPPLY — 23 items

## 2016-12-30 NOTE — H&P (Signed)
Lucilla Lame, MD [address] [place], [place] [address] Phone: [phone] Fax : [phone]  Primary Care Physician:  Crecencio Mc, MD Primary Gastroenterologist:  Dr. Allen Norris  Pre-Procedure History & Physical: HPI:  Hayley Lynn is a 56 y.o. female is here for an colonoscopy.   Past Medical History:  Diagnosis Date  . Arthritis    hands  . Blood in stool   . Chicken pox   . Depression   . Diverticulitis   . Headache    stress related  . PONV (postoperative nausea and vomiting)    after hysterectomy  . Wears contact lenses     Past Surgical History:  Procedure Laterality Date  . ABDOMINAL HYSTERECTOMY    . APPENDECTOMY    . COLONOSCOPY      Prior to Admission medications   Medication Sig Start Date End Date Taking? Authorizing Provider  ALPRAZolam Duanne Moron) 0.5 MG tablet take 1 tablet by mouth at bedtime if needed for anxiety 07/28/16  Yes Crecencio Mc, MD  estradiol (ESTRACE) 1 MG tablet take 1 and 1/2 tablets by mouth once daily 07/28/16  Yes Crecencio Mc, MD  eszopiclone Johnnye Sima) 2 MG TABS tablet take 1 tablet by mouth at bedtime 06/21/16  Yes Crecencio Mc, MD  FLUoxetine (PROZAC) 20 MG capsule take 1 capsule by mouth once daily 12/26/16  Yes Crecencio Mc, MD  hydrocortisone (ANUSOL-HC) 25 MG suppository Place 1 suppository (25 mg total) rectally 2 (two) times daily. 11/11/15  Yes Lucilla Lame, MD  phentermine (ADIPEX-P) 37.5 MG tablet 1/2 tablet in the am and early afternoon 11/17/16  Yes Crecencio Mc, MD  valACYclovir (VALTREX) 1000 MG tablet Take 1 tablet (1,000 mg total) by mouth 3 (three) times daily. 03/25/16  Yes Crecencio Mc, MD    Allergies as of 12/13/2016 - Review Complete 09/16/2016  Allergen Reaction Noted  . Sulfa antibiotics Rash 09/05/2012    Family History  Problem Relation Age of Onset  . Alcohol abuse Mother   . Arthritis Mother   . Hyperlipidemia Mother   . Stroke Mother   . Hypertension Mother   . Arthritis Father    . Hyperlipidemia Father   . Heart disease Father   . Diabetes Brother   . Arthritis Paternal Grandmother   . Hypertension Paternal Grandmother   . Arthritis Paternal Grandfather   . Hypertension Paternal Grandfather   . Stroke Paternal Grandfather     Social History   Social History  . Marital status: Married    Spouse name: N/A  . Number of children: N/A  . Years of education: N/A   Occupational History  . Not on file.   Social History Main Topics  . Smoking status: Never Smoker  . Smokeless tobacco: Never Used  . Alcohol use Yes     Comment: 1-2 drinks/month  . Drug use: No  . Sexual activity: Not on file   Other Topics Concern  . Not on file   Social History Narrative  . No narrative on file    Review of Systems: See HPI, otherwise negative ROS  Physical Exam: BP 111/75   Pulse 81   Temp 97.3 F (36.3 C) (Tympanic)   Resp 16   Ht 5\' 3"  (1.6 m)   Wt 163 lb (73.9 kg)   SpO2 99%   BMI 28.87 kg/m  General:   Alert,  pleasant and cooperative in NAD Head:  Normocephalic and atraumatic. Neck:  Supple; no  masses or thyromegaly. Lungs:  Clear throughout to auscultation.    Heart:  Regular rate and rhythm. Abdomen:  Soft, nontender and nondistended. Normal bowel sounds, without guarding, and without rebound.   Neurologic:  Alert and  oriented x4;  grossly normal neurologically.  Impression/Plan: Hayley Lynn is here for an colonoscopy to be performed for history of polyps  Risks, benefits, limitations, and alternatives regarding  colonoscopy have been reviewed with the patient.  Questions have been answered.  All parties agreeable.   Lucilla Lame, MD  12/30/2016, 8:32 AM

## 2016-12-30 NOTE — Op Note (Signed)
[facility] Patient Name: Cutina Deavila Procedure Date: 12/30/2016 8:44 AM MRN: [id number] Account #: [id number] Date of Birth: 11/23/1960 Admit Type: Outpatient Age: 56 Room: [facility] OR ROOM 01 Gender: Female Note Status: Finalized Procedure:            Colonoscopy Indications:          High risk colon cancer surveillance: Personal history                        of colonic polyps Providers:            Lucilla Lame MD, MD Referring MD:         Deborra Medina, MD (Referring MD) Medicines:            Propofol per Anesthesia Complications:        No immediate complications. Procedure:            Pre-Anesthesia Assessment:                       - Prior to the procedure, a History and Physical was                        performed, and patient medications and allergies were                        reviewed. The patient's tolerance of previous                        anesthesia was also reviewed. The risks and benefits of                        the procedure and the sedation options and risks were                        discussed with the patient. All questions were                        answered, and informed consent was obtained. Prior                        Anticoagulants: The patient has taken no previous                        anticoagulant or antiplatelet agents. ASA Grade                        Assessment: II - A patient with mild systemic disease.                        After reviewing the risks and benefits, the patient was                        deemed in satisfactory condition to undergo the                        procedure.                       After obtaining informed consent, the colonoscope was  passed under direct vision. Throughout the procedure,                        the patient's blood pressure, pulse, and oxygen                        saturations were monitored continuously. The [place] [id number]) was introduced through the                        anus and advanced to the the cecum, identified by                        appendiceal orifice and ileocecal valve. The                        colonoscopy was performed without difficulty. The                        patient tolerated the procedure well. The quality of                        the bowel preparation was excellent. Findings:      The perianal and digital rectal examinations were normal.      Multiple small-mouthed diverticula were found in the sigmoid colon and       descending colon.      Non-bleeding internal hemorrhoids were found during retroflexion. The       hemorrhoids were Grade II (internal hemorrhoids that prolapse but reduce       spontaneously). Impression:           - Diverticulosis in the sigmoid colon and in the                        descending colon.                       - Non-bleeding internal hemorrhoids.                       - No specimens collected. Recommendation:       - Discharge patient to home.                       - Resume previous diet.                       - Continue present medications.                       - Repeat colonoscopy in 5 years for surveillance. Procedure Code(s):    --- Professional ---                       [phone], Colonoscopy, flexible; diagnostic, including                        collection of specimen(s) by brushing or washing, when                        performed (  separate procedure) Diagnosis Code(s):    --- Professional ---                       Z86.010, Personal history of colonic polyps CPT copyright 2016 American Medical Association. All rights reserved. The codes documented in this report are preliminary and upon coder review may  be revised to meet current compliance requirements. Lucilla Lame MD, MD 12/30/2016 9:05:03 AM This report has been signed electronically. Number of Addenda: 0 Note Initiated On: 12/30/2016 8:44 AM Scope Withdrawal  Time: 0 hours 6 minutes 33 seconds  Total Procedure Duration: 0 hours 13 minutes 44 seconds       [facility]

## 2016-12-30 NOTE — Anesthesia Preprocedure Evaluation (Signed)
Anesthesia Evaluation  Patient identified by MRN, date of birth, ID band Patient awake    Reviewed: Allergy & Precautions, NPO status , Patient's Chart, lab work & pertinent test results  History of Anesthesia Complications (+) PONV and history of anesthetic complications  Airway Mallampati: I  TM Distance: >3 FB Neck ROM: Full    Dental no notable dental hx.    Pulmonary neg pulmonary ROS,    Pulmonary exam normal        Cardiovascular negative cardio ROS Normal cardiovascular exam     Neuro/Psych  Headaches, PSYCHIATRIC DISORDERS Anxiety Depression  Neuromuscular disease    GI/Hepatic negative GI ROS, Neg liver ROS,   Endo/Other  negative endocrine ROS  Renal/GU negative Renal ROS     Musculoskeletal  (+) Arthritis , Osteoarthritis,    Abdominal   Peds  Hematology negative hematology ROS (+)   Anesthesia Other Findings   Reproductive/Obstetrics                             Anesthesia Physical Anesthesia Plan  ASA: II  Anesthesia Plan: MAC   Post-op Pain Management:    Induction: Intravenous  Airway Management Planned:   Additional Equipment:   Intra-op Plan:   Post-operative Plan:   Informed Consent: I have reviewed the patients History and Physical, chart, labs and discussed the procedure including the risks, benefits and alternatives for the proposed anesthesia with the patient or authorized representative who has indicated his/her understanding and acceptance.     Plan Discussed with: CRNA  Anesthesia Plan Comments:         Anesthesia Quick Evaluation

## 2016-12-30 NOTE — Anesthesia Postprocedure Evaluation (Signed)
Anesthesia Post Note  Patient: Hayley Lynn  Procedure(s) Performed: Procedure(s) (LRB): COLONOSCOPY WITH PROPOFOL (N/A)  Patient location during evaluation: PACU Anesthesia Type: MAC Level of consciousness: awake and alert and oriented Pain management: pain level controlled Vital Signs Assessment: post-procedure vital signs reviewed and stable Respiratory status: spontaneous breathing and nonlabored ventilation Cardiovascular status: stable Postop Assessment: no signs of nausea or vomiting and adequate PO intake Anesthetic complications: no    Estill Batten

## 2016-12-30 NOTE — Transfer of Care (Signed)
Immediate Anesthesia Transfer of Care Note  Patient: Hayley Lynn  Procedure(s) Performed: Procedure(s): COLONOSCOPY WITH PROPOFOL (N/A)  Patient Location: PACU  Anesthesia Type: MAC  Level of Consciousness: awake, alert  and patient cooperative  Airway and Oxygen Therapy: Patient Spontanous Breathing and Patient connected to supplemental oxygen  Post-op Assessment: Post-op Vital signs reviewed, Patient's Cardiovascular Status Stable, Respiratory Function Stable, Patent Airway and No signs of Nausea or vomiting  Post-op Vital Signs: Reviewed and stable  Complications: No apparent anesthesia complications

## 2016-12-30 NOTE — Anesthesia Procedure Notes (Signed)
Procedure Name: MAC Date/Time: 12/30/2016 8:44 AM Performed by: Janna Arch Pre-anesthesia Checklist: Patient identified, Emergency Drugs available, Suction available and Patient being monitored Patient Re-evaluated:Patient Re-evaluated prior to inductionOxygen Delivery Method: Nasal cannula

## 2017-01-02 ENCOUNTER — Encounter: Payer: Self-pay | Admitting: Gastroenterology

## 2017-02-21 ENCOUNTER — Other Ambulatory Visit: Payer: Self-pay | Admitting: Internal Medicine

## 2017-02-24 ENCOUNTER — Other Ambulatory Visit: Payer: Self-pay | Admitting: Internal Medicine

## 2017-02-27 NOTE — Telephone Encounter (Signed)
Last OV with you was 03/25/16, acute visit with the NP on 09/13/2016.  Last refill was 07/28/16 #30 with 3 refills, please advise, thanks

## 2017-02-28 ENCOUNTER — Other Ambulatory Visit: Payer: Self-pay

## 2017-02-28 MED ORDER — ALPRAZOLAM 0.5 MG PO TABS: ORAL_TABLET | ORAL | 1 refills | Status: DC

## 2017-02-28 NOTE — Telephone Encounter (Signed)
Refilled: 07/28/2016 Last OV: 03/25/2016 Next OV: 04/07/2017

## 2017-02-28 NOTE — Telephone Encounter (Signed)
Approved until OV

## 2017-03-01 NOTE — Telephone Encounter (Signed)
Printed, signed and faxed.  

## 2017-03-02 ENCOUNTER — Telehealth: Payer: Self-pay | Admitting: Internal Medicine

## 2017-03-02 NOTE — Telephone Encounter (Signed)
Called patient to confirm received refill request from [facility] for Alprazolam 0.5 mg patient .  I advised patient we had already faxed to [facility] patient states she no longer uses [facility].  Will fax to [facility].  Updated with correct pharmacy in system. Called and spoke with Pam at [facility] and they voided out script for Alprazolam.

## 2017-03-02 NOTE — Telephone Encounter (Signed)
Pt called back returning your call.   Call pt @ [phone]. Thank you!

## 2017-03-27 ENCOUNTER — Other Ambulatory Visit: Payer: Self-pay | Admitting: Internal Medicine

## 2017-04-07 ENCOUNTER — Encounter: Payer: Self-pay | Admitting: Internal Medicine

## 2017-04-07 ENCOUNTER — Ambulatory Visit (INDEPENDENT_AMBULATORY_CARE_PROVIDER_SITE_OTHER): Payer: BC Managed Care – PPO | Admitting: Internal Medicine

## 2017-04-07 VITALS — BP 104/70 | HR 74 | Temp 98.1°F | Resp 16 | Ht 63.0 in | Wt 152.2 lb

## 2017-04-07 DIAGNOSIS — Z86010 Personal history of colonic polyps: Secondary | ICD-10-CM

## 2017-04-07 DIAGNOSIS — F418 Other specified anxiety disorders: Secondary | ICD-10-CM | POA: Diagnosis not present

## 2017-04-07 DIAGNOSIS — R5383 Other fatigue: Secondary | ICD-10-CM | POA: Diagnosis not present

## 2017-04-07 DIAGNOSIS — N816 Rectocele: Secondary | ICD-10-CM | POA: Diagnosis not present

## 2017-04-07 DIAGNOSIS — E663 Overweight: Secondary | ICD-10-CM | POA: Diagnosis not present

## 2017-04-07 DIAGNOSIS — R110 Nausea: Secondary | ICD-10-CM

## 2017-04-07 LAB — CBC WITH DIFFERENTIAL/PLATELET
BASOS PCT: 1 %
Basophils Absolute: 73 cells/uL (ref 0–200)
EOS PCT: 2 %
Eosinophils Absolute: 146 cells/uL (ref 15–500)
HCT: 36 % (ref 35.0–45.0)
HEMOGLOBIN: 11.7 g/dL (ref 11.7–15.5)
Lymphocytes Relative: 36 %
Lymphs Abs: 2628 cells/uL (ref 850–3900)
MCH: 31.3 pg (ref 27.0–33.0)
MCHC: 32.5 g/dL (ref 32.0–36.0)
MCV: 96.3 fL (ref 80.0–100.0)
MPV: 9.5 fL (ref 7.5–12.5)
Monocytes Absolute: 511 cells/uL (ref 200–950)
Monocytes Relative: 7 %
NEUTROS ABS: 3942 {cells}/uL (ref 1500–7800)
NEUTROS PCT: 54 %
Platelets: 298 10*3/uL (ref 140–400)
RBC: 3.74 MIL/uL — AB (ref 3.80–5.10)
RDW: 12.8 % (ref 11.0–15.0)
WBC: 7.3 10*3/uL (ref 3.8–10.8)

## 2017-04-07 MED ORDER — PANTOPRAZOLE SODIUM 40 MG PO TBEC: 40.0000 mg | DELAYED_RELEASE_TABLET | Freq: Every day | ORAL | 3 refills | Status: DC

## 2017-04-07 MED ORDER — VALACYCLOVIR HCL 1 G PO TABS: 1000.0000 mg | ORAL_TABLET | Freq: Three times a day (TID) | ORAL | 3 refills | Status: DC

## 2017-04-07 NOTE — Progress Notes (Signed)
Subjective:  Patient ID: Hayley Lynn, female    DOB: 12/12/1960  Age: 56 y.o. MRN: [id number]  CC: The primary encounter diagnosis was Nausea in adult. Diagnoses of Fatigue, unspecified type, Hx of colonic polyps, Overweight (BMI 25.0-29.9), Depression with anxiety, and Hernia, rectovaginal were also pertinent to this visit.  HPI Hayley Lynn presents for EVALUATION OF RECURRENT EPISODES of  Malaise which start with a dull frontal headache followed by abrupt onset of uncontrolled belching,  followed by nausea   The episodes are NOT accompanied by diaphoresis, shortness of breath,  RUQ pain ,.    Have been recurring for the past 6 months, occurring 3 times per week on average. Has had fewer episodes for the past month, but her last episode was several days ago .One episode resulted in a syncopal episode which occurred while she was sitting on the commode.   EMS called, but she refused transport . Threw up , felt better   Colonoscopy Feb 2018 by [facility]: internal hermorrhoids,  Sigmoid diverticulosis  5 yr folllow up advised   Overweight: she requested to resume use of phentermine in January after gaining back 10 lbs during the winter and post operatively  from her extensive GYN surgery ( acrospinous ligament fixation, anterior colporrhaphy, perineorrhaphy, and cystourethroscopy by Renard Hamper Aug 26 2016) bladder sling.  Since resuming the medication she  has lost 15 lbs and is regularly participating in aerobic exercise , but her weight has plateaued .   Diet and exercise regimen reviewed in detail.     Outpatient Medications Prior to Visit  Medication Sig Dispense Refill  . ALPRAZolam (XANAX) 0.5 MG tablet take 1 tablet by mouth at bedtime if needed for anxiety 30 tablet 1  . estradiol (ESTRACE) 1 MG tablet take 1 1/2 tablets by mouth once daily 90 tablet 3  . eszopiclone (LUNESTA) 2 MG TABS tablet take 1 tablet by mouth at bedtime 90 tablet 1  . FLUoxetine (PROZAC) 20 MG capsule take 1  capsule by mouth once daily 90 capsule 0  . hydrocortisone (ANUSOL-HC) 25 MG suppository Place 1 suppository (25 mg total) rectally 2 (two) times daily. 12 suppository 1  . phentermine (ADIPEX-P) 37.5 MG tablet 1/2 tablet in the am and early afternoon 30 tablet 2  . valACYclovir (VALTREX) 1000 MG tablet Take 1 tablet (1,000 mg total) by mouth 3 (three) times daily. 21 tablet 3   No facility-administered medications prior to visit.     Review of Systems;  Patient denies headache, fevers, malaise, unintentional weight loss, skin rash, eye pain, sinus congestion and sinus pain, sore throat, dysphagia,  hemoptysis , cough, dyspnea, wheezing, chest pain, palpitations, orthopnea, edema, abdominal pain, nausea, melena, diarrhea, constipation, flank pain, dysuria, hematuria, urinary  Frequency, nocturia, numbness, tingling, seizures,  Focal weakness, Loss of consciousness,  Tremor, insomnia, depression, anxiety, and suicidal ideation.      Objective:  BP 104/70 (BP Location: Left Arm, Patient Position: Sitting, Cuff Size: Normal)   Pulse 74   Temp 98.1 F (36.7 C) (Oral)   Resp 16   Ht 5\' 3"  (1.6 m)   Wt 152 lb 3.2 oz (69 kg)   SpO2 97%   BMI 26.96 kg/m   BP Readings from Last 3 Encounters:  04/07/17 104/70  12/30/16 108/70  09/13/16 110/70    Wt Readings from Last 3 Encounters:  04/07/17 152 lb 3.2 oz (69 kg)  12/30/16 163 lb (73.9 kg)  09/13/16 162 lb (73.5 kg)  General appearance: alert, cooperative and appears stated age Ears: normal TM's and external ear canals both ears Throat: lips, mucosa, and tongue normal; teeth and gums normal Neck: no adenopathy, no carotid bruit, supple, symmetrical, trachea midline and thyroid not enlarged, symmetric, no tenderness/mass/nodules Back: symmetric, no curvature. ROM normal. No CVA tenderness. Lungs: clear to auscultation bilaterally Heart: regular rate and rhythm, S1, S2 normal, no murmur, click, rub or gallop Abdomen: soft,  non-tender; bowel sounds normal; no masses,  no organomegaly Pulses: 2+ and symmetric Skin: Skin color, texture, turgor normal. No rashes or lesions Lymph nodes: Cervical, supraclavicular, and axillary nodes normal.  Lab Results  Component Value Date   HGBA1C 5.3 09/19/2013    Lab Results  Component Value Date   CREATININE 0.89 03/25/2016   CREATININE 0.7 07/28/2014   CREATININE 0.7 09/03/2013    Lab Results  Component Value Date   WBC 7.4 03/25/2016   HGB 12.5 03/25/2016   HCT 37.9 03/25/2016   PLT 321 03/25/2016   GLUCOSE 103 (H) 03/25/2016   CHOL 212 (H) 03/25/2016   TRIG 59 03/25/2016   HDL 86 03/25/2016   LDLDIRECT 117 03/25/2016   LDLCALC 114 03/25/2016   ALT 10 03/25/2016   AST 18 03/25/2016   NA 140 03/25/2016   K 3.9 03/25/2016   CL 104 03/25/2016   CREATININE 0.89 03/25/2016   BUN 13 03/25/2016   CO2 29 03/25/2016   TSH 3.45 03/25/2016   INR 0.9 02/22/2012   HGBA1C 5.3 09/19/2013    No results found.  Assessment & Plan:   Problem List Items Addressed This Visit    Overweight (BMI 25.0-29.9)    Improved with 15 lb wt loss in the past 4 months using appetite suppression with phentermine and daily exercise.  She has no adverse symptoms from dailu use of phentermine and feels more energetic when she takes it.  I have authorized the use of phentermine for 3 months.  Goal weight loss will be 7.5 lbs       Nausea in adult - Primary    Recurrent, as part of a constellation of symptoms which start with dull headache and belching.  EKG done due to epsiodes hallmarked by recurrent balching and a paternal history of early CAD. I have reviewed her current EKG and it is reassuring  That there are no  signs of ischemia past or present or arrhythmia.   Will treat for gastritis with PPI, repeat her abdominal ultrasound   If normal,  recommend stress testing      Relevant Orders   US Abdomen Limited RUQ   EKG 12-Lead (Completed)   Comprehensive metabolic panel    Heavy Metals Panel, Blood   Lipid panel   Hx of colonic polyps    Last colonoscopy Feb 2018 diverticulosis and internal hemorrhoids,  no polyps seen,  Repeat 2023 per [facility]      Hernia, rectovaginal    S/p sacrospinous ligament fixation , anterior colporraphy and perineorrhapy      Depression with anxiety    Chronic depressive symptoms are managed with Prozac. Prior attempts to self weaning have been accompanied by recurrence of anhedonia and irritability. Continue Prozac daily.         Other Visit Diagnoses    Fatigue, unspecified type       Relevant Orders   CBC with Differential/Platelet    A total of 40 minutes of face to face time was spent with patient more than half of which was spent  in counselling and coordination of care    I am having Ms. Vanzandt start on pantoprazole. I am also having her maintain her hydrocortisone, phentermine, eszopiclone, ALPRAZolam, estradiol, FLUoxetine, and valACYclovir.  Meds ordered this encounter  Medications  . valACYclovir (VALTREX) 1000 MG tablet    Sig: Take 1 tablet (1,000 mg total) by mouth 3 (three) times daily.    Dispense:  21 tablet    Refill:  3  . pantoprazole (PROTONIX) 40 MG tablet    Sig: Take 1 tablet (40 mg total) by mouth daily.    Dispense:  30 tablet    Refill:  3    Medications Discontinued During This Encounter  Medication Reason  . valACYclovir (VALTREX) 1000 MG tablet Reorder    Follow-up: No Follow-up on file.   Crecencio Mc, MD

## 2017-04-07 NOTE — Patient Instructions (Addendum)
I will order the ultrasound of your gallbladder,  But I am not convinced that it's the culprit  If the GB is normal, .  I am sending you  For a cardiac workup   Start taking protonix daily to see if it helps

## 2017-04-08 LAB — LIPID PANEL
CHOLESTEROL: 200 mg/dL — AB (ref <200)
HDL: 79 mg/dL (ref >50)
LDL Cholesterol: 106 mg/dL — ABNORMAL HIGH (ref <100)
Total CHOL/HDL Ratio: 2.5 Ratio (ref <5.0)
Triglycerides: 75 mg/dL (ref <150)
VLDL: 15 mg/dL (ref <30)

## 2017-04-08 LAB — COMPREHENSIVE METABOLIC PANEL
ALBUMIN: 3.6 g/dL (ref 3.6–5.1)
ALT: 8 U/L (ref 6–29)
AST: 16 U/L (ref 10–35)
Alkaline Phosphatase: 51 U/L (ref 33–130)
BUN: 12 mg/dL (ref 7–25)
CALCIUM: 8.7 mg/dL (ref 8.6–10.4)
CHLORIDE: 101 mmol/L (ref 98–110)
CO2: 27 mmol/L (ref 20–31)
CREATININE: 0.8 mg/dL (ref 0.50–1.05)
Glucose, Bld: 95 mg/dL (ref 65–99)
Potassium: 4.1 mmol/L (ref 3.5–5.3)
SODIUM: 136 mmol/L (ref 135–146)
Total Bilirubin: 0.5 mg/dL (ref 0.2–1.2)
Total Protein: 6.4 g/dL (ref 6.1–8.1)

## 2017-04-09 DIAGNOSIS — R110 Nausea: Secondary | ICD-10-CM | POA: Insufficient documentation

## 2017-04-09 NOTE — Assessment & Plan Note (Addendum)
Improved with 15 lb wt loss in the past 4 months using appetite suppression with phentermine and daily exercise.  She has no adverse symptoms from dailu use of phentermine and feels more energetic when she takes it.  I have authorized the use of phentermine for 3 months.  Goal weight loss will be 7.5 lbs

## 2017-04-09 NOTE — Assessment & Plan Note (Signed)
S/p sacrospinous ligament fixation , anterior colporraphy and perineorrhapy

## 2017-04-09 NOTE — Assessment & Plan Note (Signed)
Chronic depressive symptoms are managed with Prozac. Prior attempts to self weaning have been accompanied by recurrence of anhedonia and irritability. Continue Prozac daily.   

## 2017-04-09 NOTE — Assessment & Plan Note (Addendum)
Recurrent, as part of a constellation of symptoms which start with dull headache and belching.  EKG done due to epsiodes hallmarked by recurrent balching and a paternal history of early CAD. I have reviewed her current EKG and it is reassuring  That there are no  signs of ischemia past or present or arrhythmia.   Will treat for gastritis with PPI, repeat her abdominal ultrasound   If normal,  recommend stress testing

## 2017-04-09 NOTE — Assessment & Plan Note (Signed)
Last colonoscopy Feb 2018 diverticulosis and internal hemorrhoids,  no polyps seen,  Repeat 2023 per [facility]

## 2017-04-10 ENCOUNTER — Other Ambulatory Visit: Payer: Self-pay | Admitting: Internal Medicine

## 2017-04-13 ENCOUNTER — Other Ambulatory Visit: Payer: Self-pay | Admitting: Internal Medicine

## 2017-04-13 ENCOUNTER — Encounter: Payer: Self-pay | Admitting: Internal Medicine

## 2017-04-13 LAB — HEAVY METALS PANEL, BLOOD: Lead: <1 ug/dL (ref <5)

## 2017-04-14 ENCOUNTER — Ambulatory Visit
Admission: RE | Admit: 2017-04-14 | Discharge: 2017-04-14 | Disposition: A | Discharge status: Home / Self Care | Payer: BC Managed Care – PPO | Source: Ambulatory Visit | Attending: Internal Medicine | Admitting: Internal Medicine

## 2017-04-14 DIAGNOSIS — R110 Nausea: Secondary | ICD-10-CM

## 2017-04-14 DIAGNOSIS — K8020 Calculus of gallbladder without cholecystitis without obstruction: Secondary | ICD-10-CM | POA: Insufficient documentation

## 2017-04-16 ENCOUNTER — Encounter: Payer: Self-pay | Admitting: Internal Medicine

## 2017-04-17 ENCOUNTER — Encounter: Payer: Self-pay | Admitting: Internal Medicine

## 2017-04-18 ENCOUNTER — Other Ambulatory Visit: Payer: Self-pay | Admitting: Internal Medicine

## 2017-04-18 NOTE — Telephone Encounter (Signed)
Refilled: 11/17/2016 Last OV: 04/07/2017 Next OV: not scheduled.

## 2017-04-20 ENCOUNTER — Other Ambulatory Visit: Payer: Self-pay | Admitting: Internal Medicine

## 2017-04-20 DIAGNOSIS — R110 Nausea: Secondary | ICD-10-CM

## 2017-04-20 NOTE — Telephone Encounter (Signed)
Refill authoriozed

## 2017-04-21 NOTE — Telephone Encounter (Signed)
Printed, signed and faxed.  

## 2017-05-17 ENCOUNTER — Other Ambulatory Visit: Payer: Self-pay | Admitting: Internal Medicine

## 2017-06-23 ENCOUNTER — Encounter: Payer: Self-pay | Admitting: Internal Medicine

## 2017-06-23 NOTE — Telephone Encounter (Signed)
Treatment for Hand Mouth and foot disease - supportive.  Staying hydrated, etc.  Confirm with pt on other acute issues, mental status changes, etc.  Can be some complications.  If any concern about the diagnosis or if symptoms worsening, not improving, not eating, etc - needs to be evaluated.

## 2017-06-23 NOTE — Telephone Encounter (Signed)
Thanks

## 2017-06-29 ENCOUNTER — Other Ambulatory Visit: Payer: Self-pay | Admitting: Internal Medicine

## 2017-07-28 ENCOUNTER — Encounter: Payer: Self-pay | Admitting: Internal Medicine

## 2017-07-28 DIAGNOSIS — R110 Nausea: Secondary | ICD-10-CM

## 2017-08-09 NOTE — Telephone Encounter (Signed)
Error

## 2017-08-10 ENCOUNTER — Encounter: Payer: Self-pay | Admitting: Internal Medicine

## 2017-08-14 NOTE — Telephone Encounter (Signed)
Error

## 2017-09-24 ENCOUNTER — Other Ambulatory Visit: Payer: Self-pay | Admitting: Internal Medicine

## 2017-10-10 ENCOUNTER — Encounter: Payer: Self-pay | Admitting: Internal Medicine

## 2017-10-10 NOTE — Telephone Encounter (Signed)
Refilled: 05/18/2017 Last OV: 04/07/2017 Next OV: not scheduled

## 2017-10-12 MED ORDER — ALPRAZOLAM 0.5 MG PO TABS: ORAL_TABLET | ORAL | 1 refills | Status: DC

## 2017-10-12 NOTE — Telephone Encounter (Signed)
Printed, signed and faxed.  

## 2017-10-14 DIAGNOSIS — C44521 Squamous cell carcinoma of skin of breast: Secondary | ICD-10-CM

## 2017-10-14 HISTORY — DX: Squamous cell carcinoma of skin of breast: C44.521

## 2017-11-15 ENCOUNTER — Encounter: Payer: Self-pay | Admitting: Internal Medicine

## 2017-11-16 ENCOUNTER — Encounter: Payer: Self-pay | Admitting: Internal Medicine

## 2017-11-16 DIAGNOSIS — C44521 Squamous cell carcinoma of skin of breast: Secondary | ICD-10-CM | POA: Insufficient documentation

## 2017-11-20 ENCOUNTER — Other Ambulatory Visit: Payer: Self-pay | Admitting: Internal Medicine

## 2017-11-21 NOTE — Telephone Encounter (Signed)
Last OV 04/07/2017 Next OV none Last refill 02/22/2017 #90 with 1 refill

## 2017-11-22 NOTE — Telephone Encounter (Signed)
Printed, signed and faxed.  

## 2017-11-30 ENCOUNTER — Encounter: Payer: Self-pay | Admitting: Internal Medicine

## 2017-11-30 MED ORDER — ESTRADIOL 1 MG PO TABS: ORAL_TABLET | ORAL | 0 refills | Status: DC

## 2018-01-29 ENCOUNTER — Other Ambulatory Visit: Payer: Self-pay | Admitting: Internal Medicine

## 2018-02-01 ENCOUNTER — Other Ambulatory Visit: Payer: Self-pay | Admitting: Internal Medicine

## 2018-02-01 NOTE — Telephone Encounter (Signed)
Copied from [place] [id number]. Topic: Quick Communication - Rx Refill/Question >> Feb 01, 2018  9:26 AM Synthia Innocent wrote: Medication: eszopiclone (LUNESTA) 2 MG TABS tablet  Has the patient contacted their pharmacy? Yes.   (Agent: If no, request that the patient contact the pharmacy for the refill.) Preferred Pharmacy (with phone number or street name): [facility] Agent: Please be advised that RX refills may take up to 3 business days. We ask that you follow-up with your pharmacy.

## 2018-02-01 NOTE — Telephone Encounter (Signed)
Called [facility] about Lunesta refill request - too early. Was on hold for 5 minutes. Will try again later.

## 2018-02-01 NOTE — Telephone Encounter (Signed)
Is it okay to refill? Lunesta

## 2018-02-01 NOTE — Telephone Encounter (Signed)
Spoke with pharmacists about Lunesta refill request. States when they switched over from [facility], they changed their DEA number and some of the electronic refills did not go through. They do not have the refill from Jan.

## 2018-02-01 NOTE — Telephone Encounter (Signed)
fyi

## 2018-02-02 MED ORDER — ESZOPICLONE 2 MG PO TABS: 2.0000 mg | ORAL_TABLET | Freq: Every evening | ORAL | 0 refills | Status: DC | PRN

## 2018-02-02 NOTE — Addendum Note (Signed)
Addended by: Crecencio Mc on: 02/02/2018 08:06 AM   Modules accepted: Orders

## 2018-02-02 NOTE — Addendum Note (Signed)
Addended by: Crecencio Mc on: 02/02/2018 11:22 PM   Modules accepted: Orders

## 2018-02-02 NOTE — Telephone Encounter (Signed)
Yes,  But confirm dose  With patient.  The refill Is set for 135?

## 2018-02-02 NOTE — Telephone Encounter (Signed)
rx reduced to #45 (15 per month)

## 2018-02-02 NOTE — Telephone Encounter (Signed)
Spoke with pt and she stated that she only takes one tablet at bedtime if needed. She also stated that her insurance will only cover 15 per month.

## 2018-02-05 NOTE — Telephone Encounter (Signed)
Printed, signed and faxed.  

## 2018-02-06 ENCOUNTER — Ambulatory Visit (INDEPENDENT_AMBULATORY_CARE_PROVIDER_SITE_OTHER): Payer: BC Managed Care – PPO | Admitting: Internal Medicine

## 2018-02-06 ENCOUNTER — Encounter: Payer: Self-pay | Admitting: Internal Medicine

## 2018-02-06 ENCOUNTER — Other Ambulatory Visit (HOSPITAL_COMMUNITY)
Admission: RE | Admit: 2018-02-06 | Discharge: 2018-02-06 | Disposition: A | Discharge status: Home / Self Care | Payer: BC Managed Care – PPO | Source: Ambulatory Visit | Attending: Internal Medicine | Admitting: Internal Medicine

## 2018-02-06 VITALS — BP 104/74 | HR 74 | Temp 98.2°F | Resp 15 | Ht 63.0 in | Wt 157.6 lb

## 2018-02-06 DIAGNOSIS — Z1231 Encounter for screening mammogram for malignant neoplasm of breast: Secondary | ICD-10-CM

## 2018-02-06 DIAGNOSIS — Z124 Encounter for screening for malignant neoplasm of cervix: Secondary | ICD-10-CM | POA: Insufficient documentation

## 2018-02-06 DIAGNOSIS — R5383 Other fatigue: Secondary | ICD-10-CM | POA: Diagnosis not present

## 2018-02-06 DIAGNOSIS — Z0000 Encounter for general adult medical examination without abnormal findings: Secondary | ICD-10-CM

## 2018-02-06 DIAGNOSIS — Z1239 Encounter for other screening for malignant neoplasm of breast: Secondary | ICD-10-CM

## 2018-02-06 DIAGNOSIS — F5105 Insomnia due to other mental disorder: Secondary | ICD-10-CM

## 2018-02-06 DIAGNOSIS — E663 Overweight: Secondary | ICD-10-CM

## 2018-02-06 DIAGNOSIS — F419 Anxiety disorder, unspecified: Secondary | ICD-10-CM | POA: Diagnosis not present

## 2018-02-06 DIAGNOSIS — Z90711 Acquired absence of uterus with remaining cervical stump: Secondary | ICD-10-CM | POA: Diagnosis not present

## 2018-02-06 MED ORDER — PHENTERMINE HCL 37.5 MG PO TABS: ORAL_TABLET | ORAL | 2 refills | Status: DC

## 2018-02-06 NOTE — Progress Notes (Signed)
Patient ID: Hayley Lynn, female    DOB: 01/08/1961  Age: 57 y.o. MRN: [id number]  The patient is here for annual preventing examination and management of other chronic and acute problems.   The risk factors are reflected in the social history.  The roster of all physicians providing medical care to patient - is listed in the Snapshot section of the chart.  Activities of daily living:  The patient is 100% independent in all ADLs: dressing, toileting, feeding as well as independent mobility  Home safety : The patient has smoke detectors in the home. They wear seatbelts.  There are no firearms at home. There is no violence in the home.   There is no risks for hepatitis, STDs or HIV. There is no   history of blood transfusion. They have no travel history to infectious disease endemic areas of the world.  The patient has seen their dentist in the last six month. They have seen their eye doctor in the last year. They admit to slight hearing difficulty with regard to whispered voices and some television programs.  They have deferred audiologic testing in the last year.  They do not  have excessive sun exposure. Discussed the need for sun protection: hats, long sleeves and use of sunscreen if there is significant sun exposure.   Diet: the importance of a healthy diet is discussed. They do have a healthy diet.  The benefits of regular aerobic exercise were discussed. She walks 4 times per week ,  20 minutes.   Depression screen: there are no signs or vegative symptoms of depression- irritability, change in appetite, anhedonia, sadness/tearfullness.  Cognitive assessment: the patient manages all their financial and personal affairs and is actively engaged. They could relate day,date,year and events; recalled 2/3 objects at 3 minutes; performed clock-face test normally.  The following portions of the patient's history were reviewed and updated as appropriate: allergies, current medications, past family  history, past medical history,  past surgical history, past social history  and problem list.  Visual acuity was not assessed per patient preference since she has regular follow up with her ophthalmologist. Hearing and body mass index were assessed and reviewed.   During the course of the visit the patient was educated and counseled about appropriate screening and preventive services including : fall prevention , diabetes screening, nutrition counseling, colorectal cancer screening, and recommended immunizations.    CC: The primary encounter diagnosis was Breast cancer screening. Diagnoses of Cervical cancer screening, Overweight (BMI 25.0-29.9), Fatigue, unspecified type, History of hysterectomy, supracervical, Insomnia secondary to anxiety, and Routine general medical examination at a health care facility were also pertinent to this visit.  1) overweight:  She has lost 10 lbs since last feb 2018. Wants to resume phentermine for 3 months only.    History Hayley Lynn has a past medical history of Arthritis, Blood in stool, Chicken pox, Depression, Diverticulitis, Headache, PONV (postoperative nausea and vomiting), Squamous cell skin cancer, breast, female (10/2017), and Wears contact lenses.   She has a past surgical history that includes Appendectomy; Abdominal hysterectomy; Colonoscopy; and Colonoscopy with propofol (N/A, 12/30/2016).   Her family history includes Alcohol abuse in her mother; Arthritis in her father, mother, paternal grandfather, and paternal grandmother; Diabetes in her brother; Heart disease in her father; Hyperlipidemia in her father and mother; Hypertension in her mother, paternal grandfather, and paternal grandmother; Stroke in her mother and paternal grandfather.She reports that she has never smoked. She has never used smokeless tobacco. She reports that  she drinks alcohol. She reports that she does not use drugs.  Outpatient Medications Prior to Visit  Medication Sig Dispense  Refill  . ALPRAZolam (XANAX) 0.5 MG tablet take 1 tablet by mouth at bedtime if needed for anxiety 30 tablet 1  . estradiol (ESTRACE) 1 MG tablet TAKE 1 AND 1/2 TABLETS BY MOUTH ONCE DAILY 90 tablet 1  . eszopiclone (LUNESTA) 2 MG TABS tablet Take 1 tablet (2 mg total) by mouth at bedtime as needed for sleep. 45 tablet 0  . FLUoxetine (PROZAC) 20 MG capsule take 1 capsule by mouth once daily 90 capsule 1  . valACYclovir (VALTREX) 1000 MG tablet take 1 tablet by mouth three times a day 21 tablet 3  . phentermine (ADIPEX-P) 37.5 MG tablet take 1/2 tablet by mouth every morning and 1/2 tablet EARLY AFTERNOON 30 tablet 2  . hydrocortisone (ANUSOL-HC) 25 MG suppository Place 1 suppository (25 mg total) rectally 2 (two) times daily. (Patient not taking: Reported on 02/06/2018) 12 suppository 1  . pantoprazole (PROTONIX) 40 MG tablet Take 1 tablet (40 mg total) by mouth daily. (Patient not taking: Reported on 02/06/2018) 30 tablet 3  . valACYclovir (VALTREX) 1000 MG tablet Take 1 tablet (1,000 mg total) by mouth 3 (three) times daily. (Patient not taking: Reported on 02/06/2018) 21 tablet 3   No facility-administered medications prior to visit.     Review of Systems   Patient denies headache, fevers, malaise, unintentional weight loss, skin rash, eye pain, sinus congestion and sinus pain, sore throat, dysphagia,  hemoptysis , cough, dyspnea, wheezing, chest pain, palpitations, orthopnea, edema, abdominal pain, nausea, melena, diarrhea, constipation, flank pain, dysuria, hematuria, urinary  Frequency, nocturia, numbness, tingling, seizures,  Focal weakness, Loss of consciousness,  Tremor, insomnia, depression, anxiety, and suicidal ideation.      Objective:  BP 104/74 (BP Location: Left Arm, Patient Position: Sitting, Cuff Size: Normal)   Pulse 74   Temp 98.2 F (36.8 C) (Oral)   Resp 15   Ht 5\' 3"  (1.6 m)   Wt 157 lb 9.6 oz (71.5 kg)   SpO2 97%   BMI 27.92 kg/m   Physical Exam   General  Appearance:    Alert, cooperative, no distress, appears stated age  Head:    Normocephalic, without obvious abnormality, atraumatic  Eyes:    PERRL, conjunctiva/corneas clear, EOM's intact, fundi    benign, both eyes  Ears:    Normal TM's and external ear canals, both ears  Nose:   Nares normal, septum midline, mucosa normal, no drainage    or sinus tenderness  Throat:   Lips, mucosa, and tongue normal; teeth and gums normal  Neck:   Supple, symmetrical, trachea midline, no adenopathy;    thyroid:  no enlargement/tenderness/nodules; no carotid   bruit or JVD  Back:     Symmetric, no curvature, ROM normal, no CVA tenderness  Lungs:     Clear to auscultation bilaterally, respirations unlabored  Chest Wall:    No tenderness or deformity   Heart:    Regular rate and rhythm, S1 and S2 normal, no murmur, rub   or gallop  Breast Exam:    No tenderness, masses, or nipple abnormality  Abdomen:     Soft, non-tender, bowel sounds active all four quadrants,    no masses, no organomegaly  Genitalia:    Pelvic: cervix normal in appearance, external genitalia normal, no adnexal masses or tenderness, no cervical motion tenderness, rectovaginal septum normal, uterus normal size, shape, and consistency  and vagina normal without discharge  Extremities:   Extremities normal, atraumatic, no cyanosis or edema  Pulses:   2+ and symmetric all extremities  Skin:   Skin color, texture, turgor normal, no rashes or lesions  Lymph nodes:   Cervical, supraclavicular, and axillary nodes normal  Neurologic:   CNII-XII intact, normal strength, sensation and reflexes    throughout      Assessment & Plan:   Problem List Items Addressed This Visit    Routine general medical examination at a health care facility    Annual comprehensive preventive exam was done as well as an evaluation and management of chronic conditions .  During the course of the visit the patient was educated and counseled about appropriate  screening and preventive services including :  diabetes screening, lipid analysis with projected  10 year  risk for CAD , nutrition counseling, breast, cervical and colorectal cancer screening, and recommended immunizations.  Printed recommendations for health maintenance screenings was given      Overweight (BMI 25.0-29.9)    Previous successful in achieving 10 lb using  appetite suppression with phentermine and daily exercise.  She had no adverse symptoms from previous use of phentermine and feels more energetic when she takes it.  I have authorized the use of phentermine for 3 months.  Goal weight loss will be 7.5 lbs       Relevant Orders   Lipid panel (Completed)   Hemoglobin A1c (Completed)   Comprehensive metabolic panel (Completed)   TSH (Completed)   Insomnia secondary to anxiety    Managed with lunesta but only given 15/month,  Using Alprazolam for the other night . The risks and benefits of benzodiazepine use were reviewed  with patient today including excessive sedation leading to respiratory depression,  impaired thinking/driving, and addiction.  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            History of hysterectomy, supracervical    PAP smear done today        Other Visit Diagnoses    Breast cancer screening    -  Primary   Relevant Orders   MM SCREENING BREAST TOMO BILATERAL   Cervical cancer screening       Relevant Orders   Cytology - PAP   Fatigue, unspecified type       Relevant Orders   CBC with Differential/Platelet (Completed)      I have discontinued Britteney B. Serrao's hydrocortisone and pantoprazole. I am also having her maintain her valACYclovir, FLUoxetine, ALPRAZolam, estradiol, eszopiclone, and phentermine.  Meds ordered this encounter  Medications  . phentermine (ADIPEX-P) 37.5 MG tablet    Sig: take 1/2 tablet by mouth every morning and  1/2 tablet EARLY AFTERNOON    Dispense:  30 tablet    Refill:  2    Medications Discontinued During This Encounter  Medication Reason  . hydrocortisone (ANUSOL-HC) 25 MG suppository Completed Course  . pantoprazole (PROTONIX) 40 MG tablet Patient has not taken in last 30 days  . valACYclovir (VALTREX) 9242 MG tablet Duplicate  . phentermine (ADIPEX-P) 37.5 MG tablet Reorder    Follow-up: Return in about 6 months (around 08/09/2018).   Crecencio Mc, MD

## 2018-02-06 NOTE — Patient Instructions (Addendum)
Pap SMEAR  DONE  MAMMOGRAM ORDERED  PHENTERMINE REFILLED FOR 3 MONTHS    Health Maintenance for Postmenopausal Women Menopause is a normal process in which your reproductive ability comes to an end. This process happens gradually over a span of months to years, usually between the ages of 29 and 66. Menopause is complete when you have missed 12 consecutive menstrual periods. It is important to talk with your health care provider about some of the most common conditions that affect postmenopausal women, such as heart disease, cancer, and bone loss (osteoporosis). Adopting a healthy lifestyle and getting preventive care can help to promote your health and wellness. Those actions can also lower your chances of developing some of these common conditions. What should I know about menopause? During menopause, you may experience a number of symptoms, such as:  Moderate-to-severe hot flashes.  Night sweats.  Decrease in sex drive.  Mood swings.  Headaches.  Tiredness.  Irritability.  Memory problems.  Insomnia.  Choosing to treat or not to treat menopausal changes is an individual decision that you make with your health care provider. What should I know about hormone replacement therapy and supplements? Hormone therapy products are effective for treating symptoms that are associated with menopause, such as hot flashes and night sweats. Hormone replacement carries certain risks, especially as you become older. If you are thinking about using estrogen or estrogen with progestin treatments, discuss the benefits and risks with your health care provider. What should I know about heart disease and stroke? Heart disease, heart attack, and stroke become more likely as you age. This may be due, in part, to the hormonal changes that your body experiences during menopause. These can affect how your body processes dietary fats, triglycerides, and cholesterol. Heart attack and stroke are both medical  emergencies. There are many things that you can do to help prevent heart disease and stroke:  Have your blood pressure checked at least every 1-2 years. High blood pressure causes heart disease and increases the risk of stroke.  If you are 10-44 years old, ask your health care provider if you should take aspirin to prevent a heart attack or a stroke.  Do not use any tobacco products, including cigarettes, chewing tobacco, or electronic cigarettes. If you need help quitting, ask your health care provider.  It is important to eat a healthy diet and maintain a healthy weight. ? Be sure to include plenty of vegetables, fruits, low-fat dairy products, and lean protein. ? Avoid eating foods that are high in solid fats, added sugars, or salt (sodium).  Get regular exercise. This is one of the most important things that you can do for your health. ? Try to exercise for at least 150 minutes each week. The type of exercise that you do should increase your heart rate and make you sweat. This is known as moderate-intensity exercise. ? Try to do strengthening exercises at least twice each week. Do these in addition to the moderate-intensity exercise.  Know your numbers.Ask your health care provider to check your cholesterol and your blood glucose. Continue to have your blood tested as directed by your health care provider.  What should I know about cancer screening? There are several types of cancer. Take the following steps to reduce your risk and to catch any cancer development as early as possible. Breast Cancer  Practice breast self-awareness. ? This means understanding how your breasts normally appear and feel. ? It also means doing regular breast self-exams. Let your  health care provider know about any changes, no matter how small.  If you are 69 or older, have a clinician do a breast exam (clinical breast exam or CBE) every year. Depending on your age, family history, and medical history, it  may be recommended that you also have a yearly breast X-ray (mammogram).  If you have a family history of breast cancer, talk with your health care provider about genetic screening.  If you are at high risk for breast cancer, talk with your health care provider about having an MRI and a mammogram every year.  Breast cancer (BRCA) gene test is recommended for women who have family members with BRCA-related cancers. Results of the assessment will determine the need for genetic counseling and BRCA1 and for BRCA2 testing. BRCA-related cancers include these types: ? Breast. This occurs in males or females. ? Ovarian. ? Tubal. This may also be called fallopian tube cancer. ? Cancer of the abdominal or pelvic lining (peritoneal cancer). ? Prostate. ? Pancreatic.  Cervical, Uterine, and Ovarian Cancer Your health care provider may recommend that you be screened regularly for cancer of the pelvic organs. These include your ovaries, uterus, and vagina. This screening involves a pelvic exam, which includes checking for microscopic changes to the surface of your cervix (Pap test).  For women ages 21-65, health care providers may recommend a pelvic exam and a Pap test every three years. For women ages 35-65, they may recommend the Pap test and pelvic exam, combined with testing for human papilloma virus (HPV), every five years. Some types of HPV increase your risk of cervical cancer. Testing for HPV may also be done on women of any age who have unclear Pap test results.  Other health care providers may not recommend any screening for nonpregnant women who are considered low risk for pelvic cancer and have no symptoms. Ask your health care provider if a screening pelvic exam is right for you.  If you have had past treatment for cervical cancer or a condition that could lead to cancer, you need Pap tests and screening for cancer for at least 20 years after your treatment. If Pap tests have been discontinued  for you, your risk factors (such as having a new sexual partner) need to be reassessed to determine if you should start having screenings again. Some women have medical problems that increase the chance of getting cervical cancer. In these cases, your health care provider may recommend that you have screening and Pap tests more often.  If you have a family history of uterine cancer or ovarian cancer, talk with your health care provider about genetic screening.  If you have vaginal bleeding after reaching menopause, tell your health care provider.  There are currently no reliable tests available to screen for ovarian cancer.  Lung Cancer Lung cancer screening is recommended for adults 27-44 years old who are at high risk for lung cancer because of a history of smoking. A yearly low-dose CT scan of the lungs is recommended if you:  Currently smoke.  Have a history of at least 30 pack-years of smoking and you currently smoke or have quit within the past 15 years. A pack-year is smoking an average of one pack of cigarettes per day for one year.  Yearly screening should:  Continue until it has been 15 years since you quit.  Stop if you develop a health problem that would prevent you from having lung cancer treatment.  Colorectal Cancer  This type of cancer  can be detected and can often be prevented.  Routine colorectal cancer screening usually begins at age 43 and continues through age 19.  If you have risk factors for colon cancer, your health care provider may recommend that you be screened at an earlier age.  If you have a family history of colorectal cancer, talk with your health care provider about genetic screening.  Your health care provider may also recommend using home test kits to check for hidden blood in your stool.  A small camera at the end of a tube can be used to examine your colon directly (sigmoidoscopy or colonoscopy). This is done to check for the earliest forms of  colorectal cancer.  Direct examination of the colon should be repeated every 5-10 years until age 59. However, if early forms of precancerous polyps or small growths are found or if you have a family history or genetic risk for colorectal cancer, you may need to be screened more often.  Skin Cancer  Check your skin from head to toe regularly.  Monitor any moles. Be sure to tell your health care provider: ? About any new moles or changes in moles, especially if there is a change in a mole's shape or color. ? If you have a mole that is larger than the size of a pencil eraser.  If any of your family members has a history of skin cancer, especially at a young age, talk with your health care provider about genetic screening.  Always use sunscreen. Apply sunscreen liberally and repeatedly throughout the day.  Whenever you are outside, protect yourself by wearing long sleeves, pants, a wide-brimmed hat, and sunglasses.  What should I know about osteoporosis? Osteoporosis is a condition in which bone destruction happens more quickly than new bone creation. After menopause, you may be at an increased risk for osteoporosis. To help prevent osteoporosis or the bone fractures that can happen because of osteoporosis, the following is recommended:  If you are 74-51 years old, get at least 1,000 mg of calcium and at least 600 mg of vitamin D per day.  If you are older than age 59 but younger than age 31, get at least 1,200 mg of calcium and at least 600 mg of vitamin D per day.  If you are older than age 76, get at least 1,200 mg of calcium and at least 800 mg of vitamin D per day.  Smoking and excessive alcohol intake increase the risk of osteoporosis. Eat foods that are rich in calcium and vitamin D, and do weight-bearing exercises several times each week as directed by your health care provider. What should I know about how menopause affects my mental health? Depression may occur at any age, but it  is more common as you become older. Common symptoms of depression include:  Low or sad mood.  Changes in sleep patterns.  Changes in appetite or eating patterns.  Feeling an overall lack of motivation or enjoyment of activities that you previously enjoyed.  Frequent crying spells.  Talk with your health care provider if you think that you are experiencing depression. What should I know about immunizations? It is important that you get and maintain your immunizations. These include:  Tetanus, diphtheria, and pertussis (Tdap) booster vaccine.  Influenza every year before the flu season begins.  Pneumonia vaccine.  Shingles vaccine.  Your health care provider may also recommend other immunizations. This information is not intended to replace advice given to you by your health care provider.  Make sure you discuss any questions you have with your health care provider. Document Released: 12/23/2005 Document Revised: 05/20/2016 Document Reviewed: 08/04/2015 Elsevier Interactive Patient Education  2018 [employer].

## 2018-02-07 LAB — CBC WITH DIFFERENTIAL/PLATELET
BASOS ABS: 0.1 10*3/uL (ref 0.0–0.1)
Basophils Relative: 1.3 % (ref 0.0–3.0)
Eosinophils Absolute: 0.1 10*3/uL (ref 0.0–0.7)
Eosinophils Relative: 2.4 % (ref 0.0–5.0)
HCT: 36.7 % (ref 36.0–46.0)
HEMOGLOBIN: 12.4 g/dL (ref 12.0–15.0)
LYMPHS PCT: 51.6 % — AB (ref 12.0–46.0)
Lymphs Abs: 2.6 10*3/uL (ref 0.7–4.0)
MCHC: 33.7 g/dL (ref 30.0–36.0)
MCV: 94.8 fl (ref 78.0–100.0)
MONOS PCT: 15.1 % — AB (ref 3.0–12.0)
Monocytes Absolute: 0.8 10*3/uL (ref 0.1–1.0)
NEUTROS PCT: 29.6 % — AB (ref 43.0–77.0)
Neutro Abs: 1.5 10*3/uL (ref 1.4–7.7)
Platelets: 243 10*3/uL (ref 150.0–400.0)
RBC: 3.87 Mil/uL (ref 3.87–5.11)
RDW: 12.8 % (ref 11.5–15.5)
WBC: 5.1 10*3/uL (ref 4.0–10.5)

## 2018-02-07 LAB — LIPID PANEL
CHOL/HDL RATIO: 3
CHOLESTEROL: 177 mg/dL (ref 0–200)
HDL: 65.7 mg/dL (ref >39.00)
LDL CALC: 82 mg/dL (ref 0–99)
NonHDL: 111
TRIGLYCERIDES: 145 mg/dL (ref 0.0–149.0)
VLDL: 29 mg/dL (ref 0.0–40.0)

## 2018-02-07 LAB — COMPREHENSIVE METABOLIC PANEL
ALBUMIN: 3.7 g/dL (ref 3.5–5.2)
ALK PHOS: 48 U/L (ref 39–117)
ALT: 11 U/L (ref 0–35)
AST: 18 U/L (ref 0–37)
BUN: 17 mg/dL (ref 6–23)
CALCIUM: 8.4 mg/dL (ref 8.4–10.5)
CO2: 28 mEq/L (ref 19–32)
Chloride: 103 mEq/L (ref 96–112)
Creatinine, Ser: 0.77 mg/dL (ref 0.40–1.20)
GFR: 81.91 mL/min (ref >60.00)
Glucose, Bld: 85 mg/dL (ref 70–99)
POTASSIUM: 3.8 meq/L (ref 3.5–5.1)
Sodium: 138 mEq/L (ref 135–145)
TOTAL PROTEIN: 6.9 g/dL (ref 6.0–8.3)
Total Bilirubin: 0.2 mg/dL (ref 0.2–1.2)

## 2018-02-07 LAB — TSH: TSH: 2.53 u[IU]/mL (ref 0.35–4.50)

## 2018-02-07 LAB — HEMOGLOBIN A1C: Hgb A1c MFr Bld: 5.1 % (ref 4.6–6.5)

## 2018-02-07 NOTE — Assessment & Plan Note (Signed)
Annual comprehensive preventive exam was done as well as an evaluation and management of chronic conditions .  During the course of the visit the patient was educated and counseled about appropriate screening and preventive services including :  diabetes screening, lipid analysis with projected  10 year  risk for CAD , nutrition counseling, breast, cervical and colorectal cancer screening, and recommended immunizations.  Printed recommendations for health maintenance screenings was given 

## 2018-02-07 NOTE — Assessment & Plan Note (Signed)
Previous successful in achieving 10 lb using  appetite suppression with phentermine and daily exercise.  She had no adverse symptoms from previous use of phentermine and feels more energetic when she takes it.  I have authorized the use of phentermine for 3 months.  Goal weight loss will be 7.5 lbs

## 2018-02-07 NOTE — Assessment & Plan Note (Signed)
PAP smear done today  

## 2018-02-07 NOTE — Assessment & Plan Note (Signed)
Managed with lunesta but only given 15/month,  Using Alprazolam for the other night . The risks and benefits of benzodiazepine use were reviewed  with patient today including excessive sedation leading to respiratory depression,  impaired thinking/driving, and addiction.  Patient is a registered RN and  was advised to avoid concurrent use with alcohol, to use medication only as needed and not to share with others  .       

## 2018-02-08 ENCOUNTER — Telehealth: Payer: Self-pay | Admitting: Internal Medicine

## 2018-02-08 NOTE — Telephone Encounter (Signed)
Waiting on specimen to return

## 2018-02-08 NOTE — Telephone Encounter (Signed)
Copied from [place] [id number]. Topic: Inquiry >> Feb 08, 2018 10:07 AM Conception Chancy, NT wrote: Peter Congo is calling from [facility] and states she will need to return a pap for the pt because it did not have a name on the specimen.  Peter Congo [phone]

## 2018-02-13 LAB — CYTOLOGY - PAP
Adequacy: ABSENT
Diagnosis: NEGATIVE
HPV (WINDOPATH): NOT DETECTED

## 2018-02-15 ENCOUNTER — Telehealth: Payer: Self-pay

## 2018-02-15 NOTE — Telephone Encounter (Signed)
PA for Phentermine has been submitted.

## 2018-03-16 ENCOUNTER — Other Ambulatory Visit: Payer: Self-pay | Admitting: Internal Medicine

## 2018-03-16 MED ORDER — LINACLOTIDE 290 MCG PO CAPS: 290.0000 ug | ORAL_CAPSULE | Freq: Every day | ORAL | 0 refills | Status: DC

## 2018-03-22 ENCOUNTER — Other Ambulatory Visit: Payer: Self-pay | Admitting: Internal Medicine

## 2018-03-23 ENCOUNTER — Other Ambulatory Visit: Payer: Self-pay | Admitting: Internal Medicine

## 2018-03-23 NOTE — Telephone Encounter (Signed)
Printed, signed and faxed.  

## 2018-03-23 NOTE — Telephone Encounter (Signed)
Refilled: 02/02/2018 Last OV: 02/06/2018 Next OV: not scheduled

## 2018-04-29 ENCOUNTER — Other Ambulatory Visit: Payer: Self-pay | Admitting: Internal Medicine

## 2018-04-30 NOTE — Telephone Encounter (Signed)
REFILLED,  PEASE SCHEDULE HER 6 MONTH FOLLOW UP AT APPROPRIATE TIME   

## 2018-04-30 NOTE — Telephone Encounter (Signed)
Refilled: 10/12/2017 Last OV: 02/06/2018 Next OV: not scheduled

## 2018-04-30 NOTE — Telephone Encounter (Signed)
LMTCB. Need to schedule pt a 6 month follow up in September. P[facility] may speak with pt.    Rx has been printed, signed and faxed.

## 2018-05-01 NOTE — Telephone Encounter (Signed)
appt has been scheduled.

## 2018-05-10 ENCOUNTER — Encounter (INDEPENDENT_AMBULATORY_CARE_PROVIDER_SITE_OTHER): Payer: Self-pay

## 2018-05-24 IMAGING — MG MM DIGITAL SCREENING BILAT W/ TOMO W/ CAD
8 of 12 series · 8 of 28 positions shown · non-contrast
Comparison: Previous exam(s).

CLINICAL DATA: Screening.

EXAM:
2D DIGITAL SCREENING BILATERAL MAMMOGRAM WITH CAD AND ADJUNCT TOMO

[R MLO synth-2D]
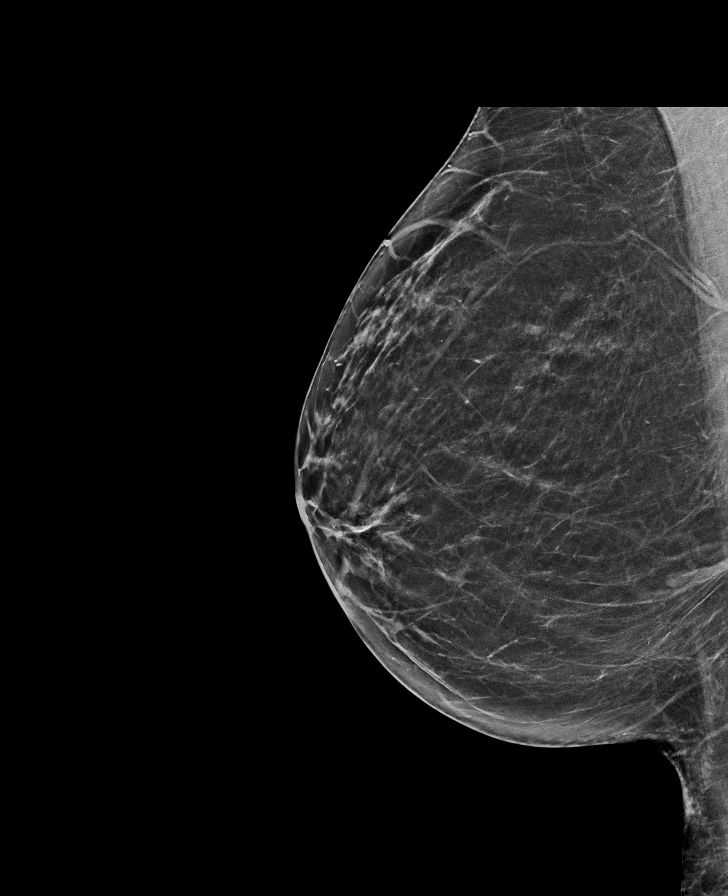

[L CC]
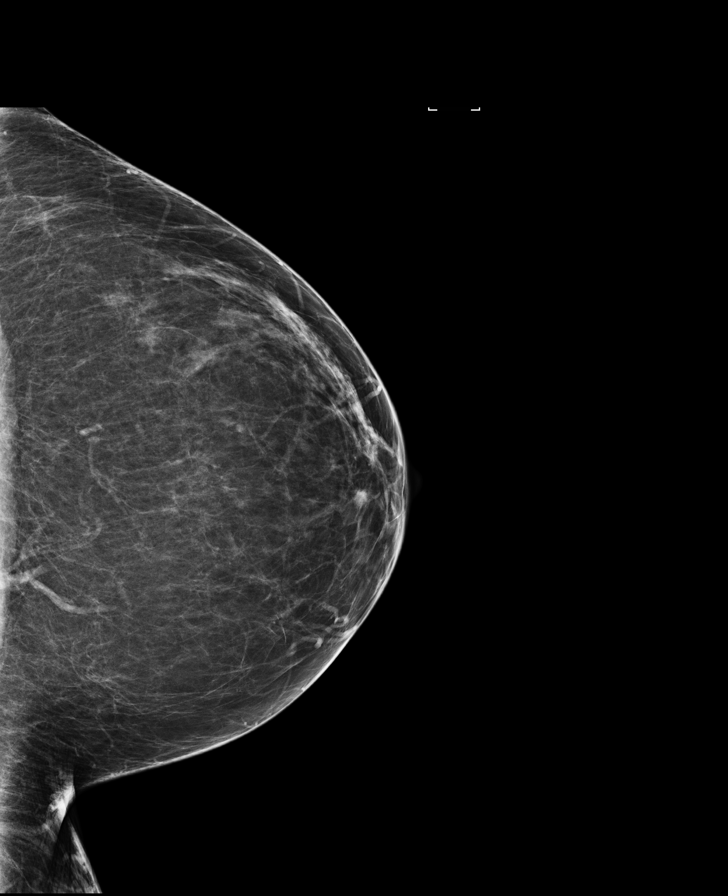

[L CC synth-2D]
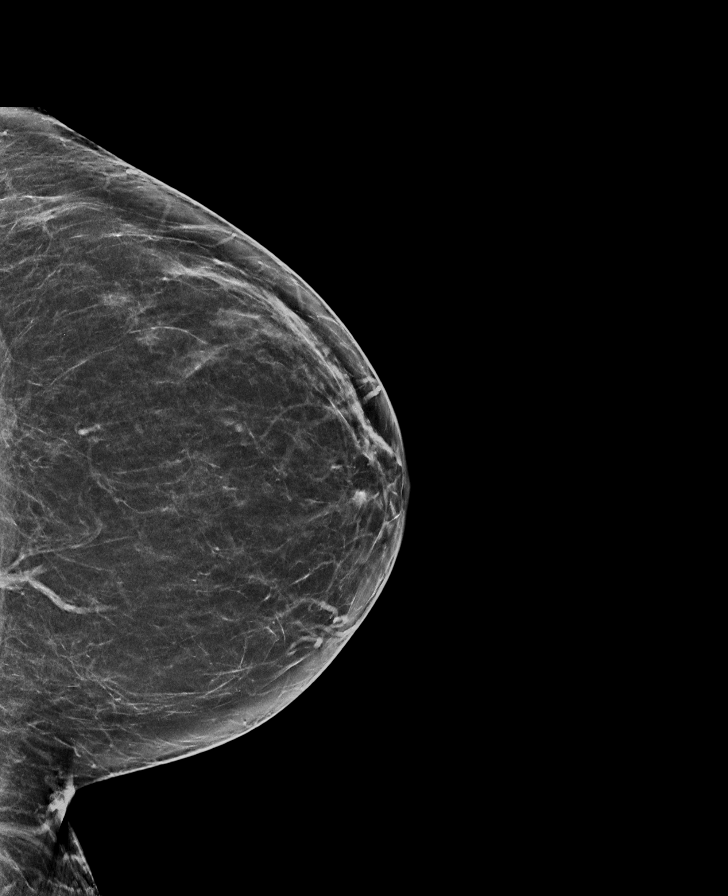

[R CC]
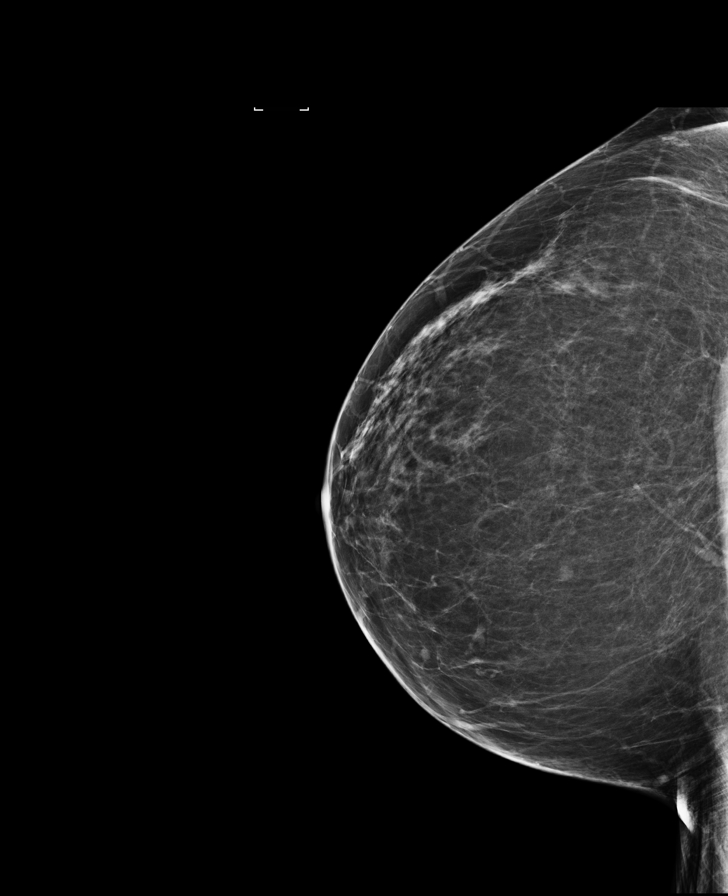

[R MLO]
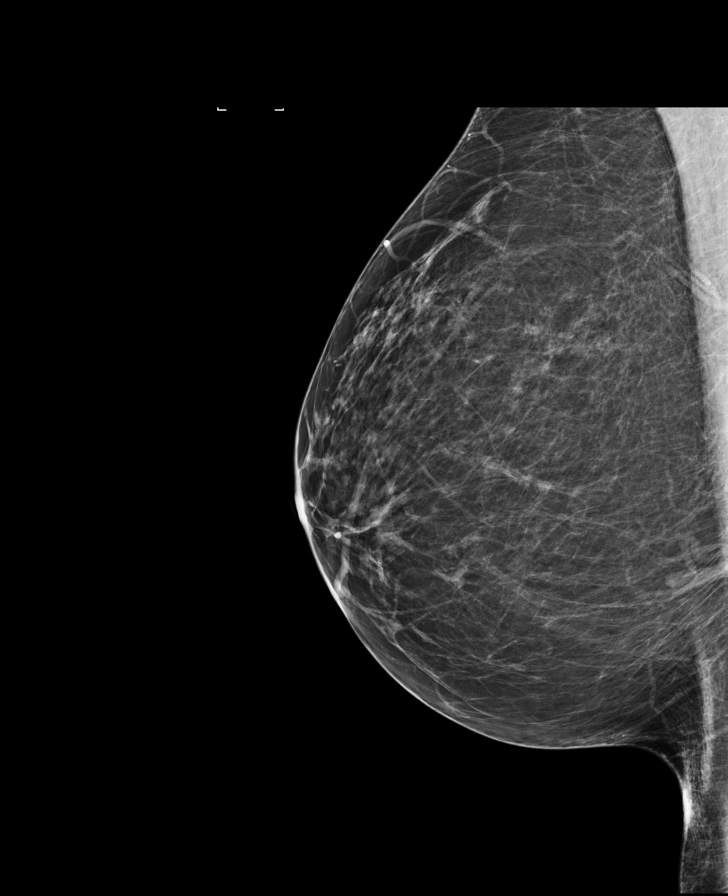

[L MLO synth-2D]
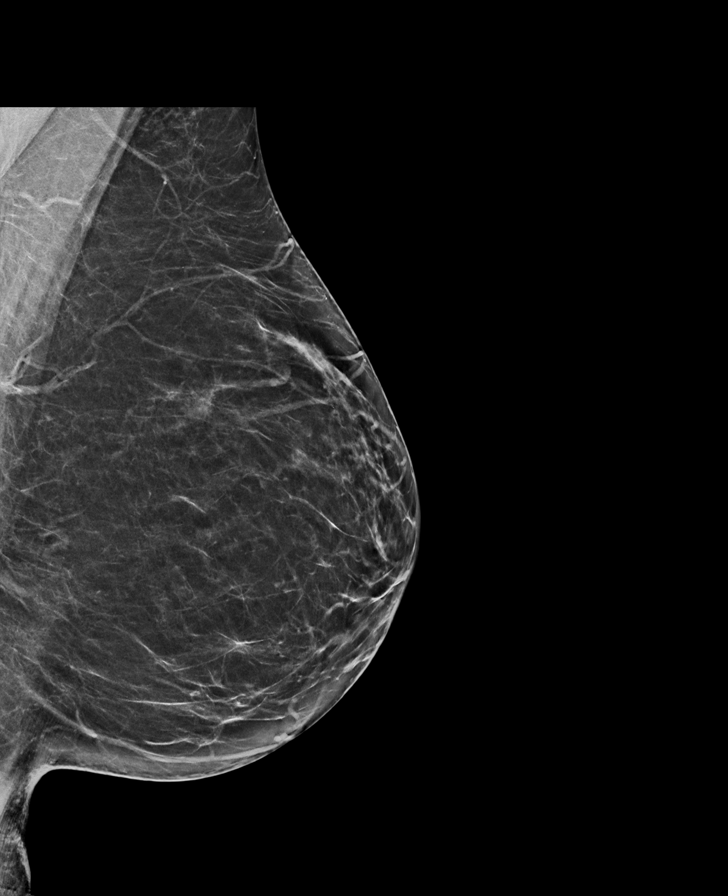

[L MLO]
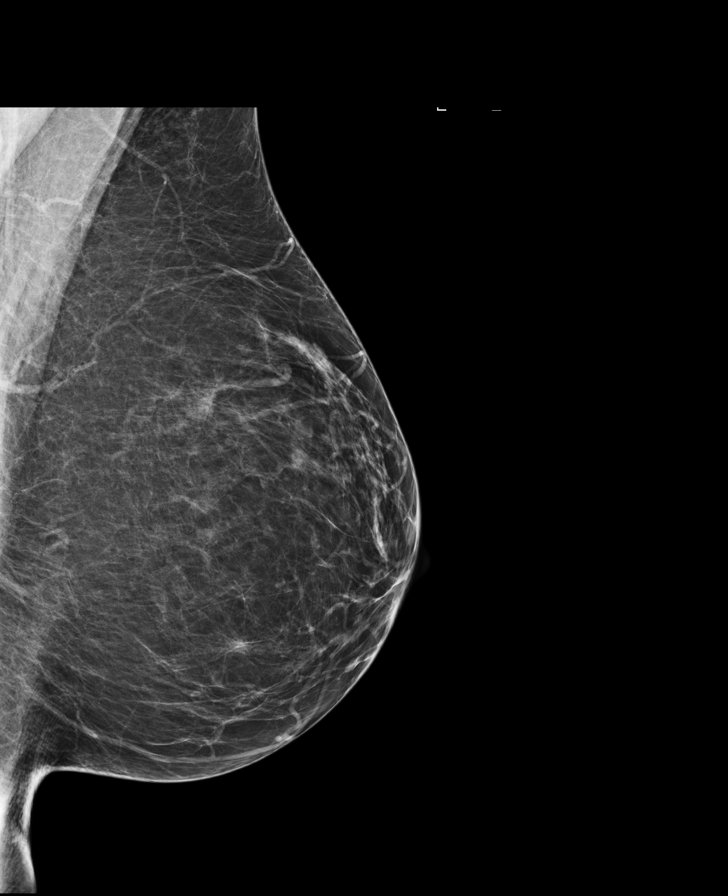

[R CC synth-2D]
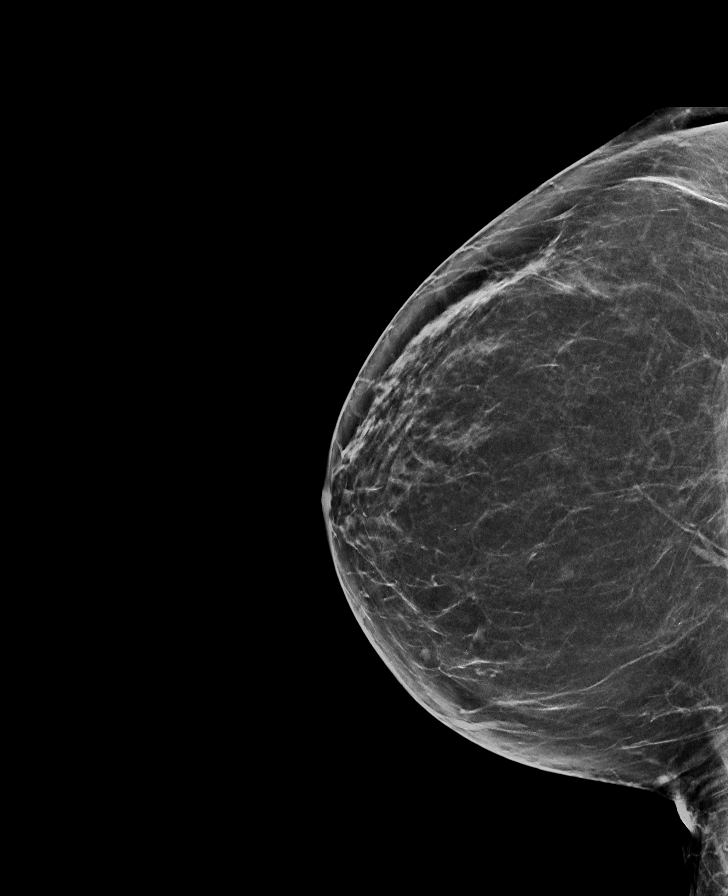

[8 of 28 positions shown; findings below may reference images not displayed]

ACR Breast Density Category b: There are scattered areas of
fibroglandular density.
FINDINGS: There are no findings suspicious for malignancy. Images were
processed with CAD.
IMPRESSION: No mammographic evidence of malignancy. A result letter of this
screening mammogram will be mailed directly to the patient.

RECOMMENDATION:
Screening mammogram in one year. (Code:97-6-RS4)

BI-RADS CATEGORY  1: Negative.

## 2018-05-30 ENCOUNTER — Other Ambulatory Visit: Payer: Self-pay | Admitting: Internal Medicine

## 2018-06-12 ENCOUNTER — Ambulatory Visit
Admission: RE | Admit: 2018-06-12 | Discharge: 2018-06-12 | Disposition: A | Discharge status: Home / Self Care | Payer: BC Managed Care – PPO | Source: Ambulatory Visit | Attending: Internal Medicine | Admitting: Internal Medicine

## 2018-06-12 DIAGNOSIS — Z1231 Encounter for screening mammogram for malignant neoplasm of breast: Secondary | ICD-10-CM | POA: Insufficient documentation

## 2018-06-12 DIAGNOSIS — Z1239 Encounter for other screening for malignant neoplasm of breast: Secondary | ICD-10-CM

## 2018-06-27 ENCOUNTER — Other Ambulatory Visit: Payer: Self-pay | Admitting: Internal Medicine

## 2018-06-28 NOTE — Telephone Encounter (Signed)
Refilled: 02/06/2018 Last OV: 02/06/2018 Next OV: 07/27/2018

## 2018-06-28 NOTE — Telephone Encounter (Signed)
Phentermine cannot be refilled without an office appt to check that she has lost the required 5%

## 2018-07-20 ENCOUNTER — Ambulatory Visit: Payer: BC Managed Care – PPO | Admitting: Internal Medicine

## 2018-07-27 ENCOUNTER — Ambulatory Visit: Payer: BC Managed Care – PPO | Admitting: Internal Medicine

## 2018-09-07 ENCOUNTER — Ambulatory Visit: Payer: BC Managed Care – PPO | Admitting: Internal Medicine

## 2018-09-07 ENCOUNTER — Encounter: Payer: Self-pay | Admitting: Internal Medicine

## 2018-09-07 VITALS — BP 100/58 | HR 75 | Temp 97.9°F | Resp 14 | Ht 63.0 in | Wt 162.4 lb

## 2018-09-07 DIAGNOSIS — F419 Anxiety disorder, unspecified: Secondary | ICD-10-CM

## 2018-09-07 DIAGNOSIS — F418 Other specified anxiety disorders: Secondary | ICD-10-CM | POA: Diagnosis not present

## 2018-09-07 DIAGNOSIS — Z23 Encounter for immunization: Secondary | ICD-10-CM | POA: Diagnosis not present

## 2018-09-07 DIAGNOSIS — F5105 Insomnia due to other mental disorder: Secondary | ICD-10-CM

## 2018-09-07 DIAGNOSIS — E663 Overweight: Secondary | ICD-10-CM | POA: Diagnosis not present

## 2018-09-07 MED ORDER — ESZOPICLONE 2 MG PO TABS: 2.0000 mg | ORAL_TABLET | Freq: Every day | ORAL | 3 refills | Status: DC

## 2018-09-07 NOTE — Progress Notes (Signed)
Subjective:  Patient ID: Hayley Lynn, female    DOB: 18-May-1961  Age: 57 y.o. MRN: [id number]  CC: The primary encounter diagnosis was Need for diphtheria-tetanus-pertussis (Tdap) vaccine. Diagnoses of Depression with anxiety, Insomnia secondary to anxiety, and Overweight (BMI 25.0-29.9) were also pertinent to this visit.  HPI Hayley Lynn presents for  Medication refill and follow up on insomnia and depression   Her insomnia is chronic, with no improvement using over-the-counter first generation antihistamines. Reviewed principles of good sleep hygiene. Although she is a snorer, there is no report of apneic spells by husband.   She has been alternating between maximal ose of Lunesta . And alprazolam .  Previous use of Ambien and Ambien CR was ineffective.   Outpatient Medications Prior to Visit  Medication Sig Dispense Refill  . ALPRAZolam (XANAX) 0.5 MG tablet TAKE 1 TABLET BY MOUTH EVERY NIGHT AT BEDTIME AS NEEDED 30 tablet 3  . estradiol (ESTRACE) 1 MG tablet TAKE 1 AND 1/2 TABLETS BY MOUTH ONCE DAILY 90 tablet 1  . FLUoxetine (PROZAC) 20 MG capsule TAKE 1 CAPSULE BY MOUTH ONCE DAILY 90 capsule 1  . valACYclovir (VALTREX) 1000 MG tablet take 1 tablet by mouth three times a day 21 tablet 3  . eszopiclone (LUNESTA) 2 MG TABS tablet TAKE 1 TABLET BY MOUTH EVERY DAY AT BEDTIME AS NEEDED FOR SLEEP 45 tablet 3  . linaclotide (LINZESS) 290 MCG CAPS capsule Take 1 capsule (290 mcg total) by mouth daily before breakfast. (Patient not taking: Reported on 09/07/2018) 30 capsule 0  . phentermine (ADIPEX-P) 37.5 MG tablet take 1/2 tablet by mouth every morning and 1/2 tablet EARLY AFTERNOON (Patient not taking: Reported on 09/07/2018) 30 tablet 2   No facility-administered medications prior to visit.     Review of Systems;  Patient denies headache, fevers, malaise, unintentional weight loss, skin rash, eye pain, sinus congestion and sinus pain, sore throat, dysphagia,  hemoptysis , cough,  dyspnea, wheezing, chest pain, palpitations, orthopnea, edema, abdominal pain, nausea, melena, diarrhea, constipation, flank pain, dysuria, hematuria, urinary  Frequency, nocturia, numbness, tingling, seizures,  Focal weakness, Loss of consciousness,  Tremor, insomnia, depression, anxiety, and suicidal ideation.      Objective:  BP (!) 100/58 (BP Location: Left Arm, Patient Position: Sitting, Cuff Size: Normal)   Pulse 75   Temp 97.9 F (36.6 C) (Oral)   Resp 14   Ht 5\' 3"  (1.6 m)   Wt 162 lb 6.4 oz (73.7 kg)   SpO2 97%   BMI 28.77 kg/m   BP Readings from Last 3 Encounters:  09/07/18 (!) 100/58  02/06/18 104/74  04/07/17 104/70    Wt Readings from Last 3 Encounters:  09/07/18 162 lb 6.4 oz (73.7 kg)  02/06/18 157 lb 9.6 oz (71.5 kg)  04/07/17 152 lb 3.2 oz (69 kg)    General appearance: alert, cooperative and appears stated age Ears: normal TM's and external ear canals both ears Throat: lips, mucosa, and tongue normal; teeth and gums normal Neck: no adenopathy, no carotid bruit, supple, symmetrical, trachea midline and thyroid not enlarged, symmetric, no tenderness/mass/nodules Back: symmetric, no curvature. ROM normal. No CVA tenderness. Lungs: clear to auscultation bilaterally Heart: regular rate and rhythm, S1, S2 normal, no murmur, click, rub or gallop Abdomen: soft, non-tender; bowel sounds normal; no masses,  no organomegaly Pulses: 2+ and symmetric Skin: Skin color, texture, turgor normal. No rashes or lesions Lymph nodes: Cervical, supraclavicular, and axillary nodes normal.  Lab Results  Component Value  Date   HGBA1C 5.1 02/06/2018   HGBA1C 5.3 09/19/2013    Lab Results  Component Value Date   CREATININE 0.77 02/06/2018   CREATININE 0.80 04/07/2017   CREATININE 0.89 03/25/2016    Lab Results  Component Value Date   WBC 5.1 02/06/2018   HGB 12.4 02/06/2018   HCT 36.7 02/06/2018   PLT 243.0 02/06/2018   GLUCOSE 85 02/06/2018   CHOL 177 02/06/2018    TRIG 145.0 02/06/2018   HDL 65.70 02/06/2018   LDLDIRECT 117 03/25/2016   LDLCALC 82 02/06/2018   ALT 11 02/06/2018   AST 18 02/06/2018   NA 138 02/06/2018   K 3.8 02/06/2018   CL 103 02/06/2018   CREATININE 0.77 02/06/2018   BUN 17 02/06/2018   CO2 28 02/06/2018   TSH 2.53 02/06/2018   INR 0.9 02/22/2012   HGBA1C 5.1 02/06/2018    Mm Screening Breast Tomo Bilateral  Result Date: 06/12/2018 CLINICAL DATA:  Screening. EXAM: DIGITAL SCREENING BILATERAL MAMMOGRAM WITH TOMO AND CAD COMPARISON:  Previous exam(s). ACR Breast Density Category b: There are scattered areas of fibroglandular density. FINDINGS: There are no findings suspicious for malignancy. Images were processed with CAD. IMPRESSION: No mammographic evidence of malignancy. A result letter of this screening mammogram will be mailed directly to the patient. RECOMMENDATION: Screening mammogram in one year. (Code:SM-B-01Y) BI-RADS CATEGORY  1: Negative. Electronically Signed   By: Everlean Alstrom M.D.   On: 06/12/2018 12:58    Assessment & Plan:   Problem List Items Addressed This Visit    Depression with anxiety    Chronic depressive symptoms are managed with Prozac. Prior attempts to self weaning have been accompanied by recurrence of anhedonia and irritability. Continue Prozac daily.        Insomnia secondary to anxiety    Managed with lunesta but only given 15/month,  Using Alprazolam for the other night . The risks and benefits of benzodiazepine use were reviewed  with patient today including excessive sedation leading to respiratory depression,  impaired thinking/driving, and addiction.  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            Overweight (BMI 25.0-29.9)    Previous successful in achieving 10 lb using  appetite suppression with phentermine and daily exercise.  She has had weight gain since stopping the  medication.  .I have addressed  BMI and recommended wt loss of 10% of body weight over the next 6 months using a low glycemic index diet and regular exercise a minimum of 5 days per week.         Other Visit Diagnoses    Need for diphtheria-tetanus-pertussis (Tdap) vaccine    -  Primary   Relevant Orders   Tdap vaccine greater than or equal to 7yo IM (Completed)      I have discontinued Mykia B. Bowery's phentermine and linaclotide. I have also changed her eszopiclone. Additionally, I am having her maintain her valACYclovir, FLUoxetine, ALPRAZolam, and estradiol.  Meds ordered this encounter  Medications  . eszopiclone (LUNESTA) 2 MG TABS tablet    Sig: Take 1 tablet (2 mg total) by mouth at bedtime. Take immediately before bedtime    Dispense:  90 tablet    Refill:  3    Medications Discontinued During This Encounter  Medication Reason  . linaclotide (LINZESS) 290 MCG CAPS capsule Error  .  phentermine (ADIPEX-P) 37.5 MG tablet   . eszopiclone (LUNESTA) 2 MG TABS tablet     Follow-up: No follow-ups on file.   Crecencio Mc, MD

## 2018-09-07 NOTE — Patient Instructions (Signed)
You had the TDaP vaccine today.  It's good for ten years   See you In 6 months   You gained 5 lbs   .Marland KitchenMarland KitchenMarland KitchenMarland Kitchen

## 2018-09-09 NOTE — Assessment & Plan Note (Signed)
Chronic depressive symptoms are managed with Prozac. Prior attempts to self weaning have been accompanied by recurrence of anhedonia and irritability. Continue Prozac daily.

## 2018-09-09 NOTE — Assessment & Plan Note (Signed)
Managed with lunesta but only given 15/month,  Using Alprazolam for the other night . The risks and benefits of benzodiazepine use were reviewed  with patient today including excessive sedation leading to respiratory depression,  impaired thinking/driving, and addiction.  Patient is a registered RN and  was advised to avoid concurrent use with alcohol, to use medication only as needed and not to share with others  .       

## 2018-09-09 NOTE — Assessment & Plan Note (Signed)
Previous successful in achieving 10 lb using  appetite suppression with phentermine and daily exercise.  She has had weight gain since stopping the medication.  .I have addressed  BMI and recommended wt loss of 10% of body weight over the next 6 months using a low glycemic index diet and regular exercise a minimum of 5 days per week.

## 2018-09-23 ENCOUNTER — Other Ambulatory Visit: Payer: Self-pay | Admitting: Internal Medicine

## 2018-09-27 ENCOUNTER — Other Ambulatory Visit: Payer: Self-pay | Admitting: Internal Medicine

## 2018-12-05 ENCOUNTER — Other Ambulatory Visit: Payer: Self-pay | Admitting: Internal Medicine

## 2018-12-05 NOTE — Telephone Encounter (Signed)
Last OV 09/07/2018   Xanax last refilled 04/30/2018 disp 30 with 3 refills   Estrace last refilled 09/27/2018 90 tablets with no refills   Sent to PCP for approval

## 2018-12-18 ENCOUNTER — Other Ambulatory Visit: Payer: Self-pay | Admitting: Internal Medicine

## 2019-02-05 ENCOUNTER — Other Ambulatory Visit: Payer: Self-pay

## 2019-02-05 MED ORDER — ESTRADIOL 1 MG PO TABS: 1.5000 mg | ORAL_TABLET | Freq: Every day | ORAL | 0 refills | Status: DC

## 2019-03-01 ENCOUNTER — Telehealth: Payer: BC Managed Care – PPO | Admitting: Family

## 2019-03-01 DIAGNOSIS — H1033 Unspecified acute conjunctivitis, bilateral: Secondary | ICD-10-CM | POA: Diagnosis not present

## 2019-03-01 NOTE — Progress Notes (Signed)
We are sorry that you are not feeling well.  Here is how we plan to help!  Based on what you have shared with me it looks like you have conjunctivitis.  Conjunctivitis is a common inflammatory or infectious condition of the eye that is often referred to as "pink eye".  In most cases it is contagious (viral or bacterial). However, not all conjunctivitis requires antibiotics (ex. Allergic).  We have made appropriate suggestions for you based upon your presentation.  I recommend that you use OpconA, 1-2 drops every 4-6 hours (an over the counter allergy drop available at your local pharmacy).  Your pharmacist may have an alternative suggestion.  Pink eye can be highly contagious.  It is typically spread through direct contact with secretions, or contaminated objects or surfaces that one may have touched.  Strict handwashing is suggested with soap and water is urged.  If not available, use alcohol based had sanitizer.  Avoid unnecessary touching of the eye.  If you wear contact lenses, you will need to refrain from wearing them until you see no white discharge from the eye for at least 24 hours after being on medication.  You should see symptom improvement in 1-2 days after starting the medication regimen.  Call us if symptoms are not improved in 1-2 days.  Home Care:  Wash your hands often!  Do not wear your contacts until you complete your treatment plan.  Avoid sharing towels, bed linen, personal items with a person who has pink eye.  See attention for anyone in your home with similar symptoms.  Get Help Right Away If:  Your symptoms do not improve.  You develop blurred or loss of vision.  Your symptoms worsen (increased discharge, pain or redness)  Your e-visit answers were reviewed by a board certified advanced clinical practitioner to complete your personal care plan.  Depending on the condition, your plan could have included both over the counter or prescription medications.  If there  is a problem please reply  once you have received a response from your provider.  Your safety is important to us.  If you have drug allergies check your prescription carefully.    You can use MyChart to ask questions about today's visit, request a non-urgent call back, or ask for a work or school excuse for 24 hours related to this e-Visit. If it has been greater than 24 hours you will need to follow up with your provider, or enter a new e-Visit to address those concerns.   You will get an e-mail in the next two days asking about your experience.  I hope that your e-visit has been valuable and will speed your recovery. Thank you for using e-visits.     

## 2019-04-15 ENCOUNTER — Other Ambulatory Visit: Payer: Self-pay | Admitting: Internal Medicine

## 2019-04-15 MED ORDER — ESTROGENS, CONJUGATED 0.625 MG/GM VA CREA: TOPICAL_CREAM | VAGINAL | 12 refills | Status: DC

## 2019-05-03 NOTE — Progress Notes (Signed)
Greater than 5 minutes, yet less than 10 minutes of time have been spent researching, coordinating, and implementing care for this patient today.  Thank you for the details you included in the comment boxes. Those details are very helpful in determining the best course of treatment for you and help us to provide the best care.  

## 2019-05-08 ENCOUNTER — Other Ambulatory Visit: Payer: Self-pay | Admitting: Internal Medicine

## 2019-05-08 MED ORDER — ALPRAZOLAM 0.5 MG PO TABS: 0.5000 mg | ORAL_TABLET | Freq: Every evening | ORAL | 5 refills | Status: DC | PRN

## 2019-05-08 MED ORDER — ESZOPICLONE 2 MG PO TABS: 2.0000 mg | ORAL_TABLET | Freq: Every day | ORAL | 1 refills | Status: DC

## 2019-06-16 MED ORDER — VALACYCLOVIR HCL 1 G PO TABS: 1000.0000 mg | ORAL_TABLET | Freq: Three times a day (TID) | ORAL | 3 refills | Status: DC

## 2019-06-18 ENCOUNTER — Other Ambulatory Visit: Payer: Self-pay | Admitting: Internal Medicine

## 2019-07-01 ENCOUNTER — Other Ambulatory Visit: Payer: Self-pay

## 2019-07-01 MED ORDER — ESTRADIOL 1 MG PO TABS: 1.5000 mg | ORAL_TABLET | Freq: Every day | ORAL | 0 refills | Status: DC

## 2019-08-19 ENCOUNTER — Other Ambulatory Visit: Payer: Self-pay | Admitting: Internal Medicine

## 2019-08-19 DIAGNOSIS — Z1231 Encounter for screening mammogram for malignant neoplasm of breast: Secondary | ICD-10-CM

## 2019-08-25 ENCOUNTER — Other Ambulatory Visit: Payer: Self-pay

## 2019-08-26 MED ORDER — ESTRADIOL 1 MG PO TABS: 1.5000 mg | ORAL_TABLET | Freq: Every day | ORAL | 0 refills | Status: DC

## 2019-09-09 ENCOUNTER — Ambulatory Visit
Admission: RE | Admit: 2019-09-09 | Discharge: 2019-09-09 | Disposition: A | Discharge status: Home / Self Care | Payer: BC Managed Care – PPO | Source: Ambulatory Visit | Attending: Internal Medicine | Admitting: Internal Medicine

## 2019-09-09 ENCOUNTER — Other Ambulatory Visit: Payer: Self-pay

## 2019-09-09 DIAGNOSIS — Z1231 Encounter for screening mammogram for malignant neoplasm of breast: Secondary | ICD-10-CM | POA: Insufficient documentation

## 2019-09-11 ENCOUNTER — Other Ambulatory Visit: Payer: Self-pay

## 2019-09-11 ENCOUNTER — Telehealth: Payer: BC Managed Care – PPO | Admitting: Nurse Practitioner

## 2019-09-11 DIAGNOSIS — R52 Pain, unspecified: Secondary | ICD-10-CM

## 2019-09-11 DIAGNOSIS — R05 Cough: Secondary | ICD-10-CM

## 2019-09-11 DIAGNOSIS — Z20822 Contact with and (suspected) exposure to covid-19: Secondary | ICD-10-CM

## 2019-09-11 DIAGNOSIS — Z20828 Contact with and (suspected) exposure to other viral communicable diseases: Secondary | ICD-10-CM

## 2019-09-11 DIAGNOSIS — R059 Cough, unspecified: Secondary | ICD-10-CM

## 2019-09-11 DIAGNOSIS — J029 Acute pharyngitis, unspecified: Secondary | ICD-10-CM

## 2019-09-11 MED ORDER — BENZONATATE 100 MG PO CAPS: 100.0000 mg | ORAL_CAPSULE | Freq: Three times a day (TID) | ORAL | 0 refills | Status: DC | PRN

## 2019-09-11 NOTE — Progress Notes (Signed)
E-Visit for Corona Virus Screening   Your current symptoms could be consistent with the coronavirus.  Many health care providers can now test patients at their office but not all are.  [facility] has multiple testing sites. For information on our COVID testing locations and hours go to [email or web address]  Please quarantine yourself while awaiting your test results.  We are enrolling you in our [facility] for [place] . Daily you will receive a questionnaire within the [facility] website. Our COVID 19 response team willl be monitoriing your responses daily.  You can go to one of the  testing sites listed below, while they are opened (see hours). You do not need a doctors order to be tested for covid.You do need to self-isolate until your results return and if positive 14 days from when your symptoms started and until you are 3 days symptom free.   Testing Locations (Monday - Friday, 8 a.m. - 3:30 p.m.)   [place]: [facility] Entrance), [address], [place], [place]: [address], [place], [place], [place] (entrance off [address] (Closed each Monday): [address], [place], [place] - the short stay covered drive at [facility] (Use the [employer] entrance to [facility] next to [place] is a respiratory illness with symptoms that are similar to the flu. Symptoms are typically mild to moderate, but there have been cases of severe illness and death due to the virus. The following symptoms may appear 2-14 days after exposure: . Fever . Cough . Shortness of breath or difficulty breathing . Chills . Repeated shaking with chills . Muscle pain . Headache . Sore throat . New loss of taste or smell . Fatigue . Congestion or runny nose . Nausea or vomiting . Diarrhea  It is vitally  important that if you feel that you have an infection such as this virus or any other virus that you stay home and away from places where you may spread it to others.  You should self-quarantine for 14 days if you have symptoms that could potentially be coronavirus or have been in close contact a with a person diagnosed with COVID-19 within the last 2 weeks. You should avoid contact with people age 49 and older.   You should wear a mask or cloth face covering over your nose and mouth if you must be around other people or animals, including pets (even at home). Try to stay at least 6 feet away from other people. This will protect the people around you.  You can use medication such as A prescription cough medication called Tessalon Perles 100 mg. You may take 1-2 capsules every 8 hours as needed for cough  You may also take acetaminophen (Tylenol) as needed for fever.   Reduce your risk of any infection by using the same precautions used for avoiding the common cold or flu:  [name] Wash your hands often with soap and warm water for at least 20 seconds.  If soap and water are not readily available, use an alcohol-based hand sanitizer with at least 60% alcohol.  . If coughing or sneezing, cover your mouth and nose by coughing or sneezing into the elbow areas of your shirt or coat, into a tissue or into your sleeve (not your hands). . Avoid shaking hands with others and consider head nods  or verbal greetings only. . Avoid touching your eyes, nose, or mouth with unwashed hands.  . Avoid close contact with people who are sick. . Avoid places or events with large numbers of people in one location, like concerts or sporting events. . Carefully consider travel plans you have or are making. . If you are planning any travel outside or inside the [place], visit the CDC's Travelers' Health webpage for the latest health notices. . If you have some symptoms but not all symptoms, continue to monitor at home and seek medical  attention if your symptoms worsen. . If you are having a medical emergency, call 911.  HOME CARE . Only take medications as instructed by your medical team. . Drink plenty of fluids and get plenty of rest. . A steam or ultrasonic humidifier can help if you have congestion.   GET HELP RIGHT AWAY IF YOU HAVE EMERGENCY WARNING SIGNS** FOR COVID-19. If you or someone is showing any of these signs seek emergency medical care immediately. Call 911 or proceed to your closest emergency facility if: . You develop worsening high fever. . Trouble breathing . Bluish lips or face . Persistent pain or pressure in the chest . New confusion . Inability to wake or stay awake . You cough up blood. . Your symptoms become more severe  **This list is not all possible symptoms. Contact your medical provider for any symptoms that are sever or concerning to you.   MAKE SURE YOU   Understand these instructions.  Will watch your condition.  Will get help right away if you are not doing well or get worse.  Your e-visit answers were reviewed by a board certified advanced clinical practitioner to complete your personal care plan.  Depending on the condition, your plan could have included both over the counter or prescription medications.  If there is a problem please reply once you have received a response from your provider.  Your safety is important to [place].  If you have drug allergies check your prescription carefully.    You can use MyChart to ask questions about today's visit, request a non-urgent call back, or ask for a work or school excuse for 24 hours related to this e-Visit. If it has been greater than 24 hours you will need to follow up with your provider, or enter a new e-Visit to address those concerns. You will get an e-mail in the next two days asking about your experience.  I hope that your e-visit has been valuable and will speed your recovery. Thank you for using e-visits.   5-10 minutes  spent reviewing and documenting in chart.

## 2019-09-12 LAB — NOVEL CORONAVIRUS, NAA: SARS-CoV-2, NAA: NOT DETECTED

## 2019-09-24 ENCOUNTER — Other Ambulatory Visit: Payer: Self-pay

## 2019-09-24 MED ORDER — FLUOXETINE HCL 20 MG PO CAPS: 20.0000 mg | ORAL_CAPSULE | Freq: Every day | ORAL | 0 refills | Status: DC

## 2019-10-26 ENCOUNTER — Other Ambulatory Visit: Payer: Self-pay

## 2019-10-28 MED ORDER — ESTRADIOL 1 MG PO TABS: 1.5000 mg | ORAL_TABLET | Freq: Every day | ORAL | 0 refills | Status: DC

## 2019-10-28 NOTE — Telephone Encounter (Signed)
Refilled: 08/26/2019 Last OV: 09/07/2018 Next OV: not scheduled

## 2019-11-06 NOTE — Telephone Encounter (Signed)
Refilled: 05/08/2019 Last OV: 09/07/2018 Next OV: not scheduled

## 2019-11-07 MED ORDER — ESZOPICLONE 2 MG PO TABS: 2.0000 mg | ORAL_TABLET | Freq: Every day | ORAL | 0 refills | Status: DC

## 2019-11-26 ENCOUNTER — Other Ambulatory Visit: Payer: Self-pay

## 2019-12-02 ENCOUNTER — Ambulatory Visit: Payer: BC Managed Care – PPO | Admitting: Internal Medicine

## 2019-12-02 ENCOUNTER — Encounter: Payer: Self-pay | Admitting: Internal Medicine

## 2019-12-02 ENCOUNTER — Other Ambulatory Visit: Payer: Self-pay

## 2019-12-02 ENCOUNTER — Ambulatory Visit (INDEPENDENT_AMBULATORY_CARE_PROVIDER_SITE_OTHER): Payer: BC Managed Care – PPO | Admitting: Internal Medicine

## 2019-12-02 VITALS — BP 104/72 | HR 74 | Temp 96.2°F | Resp 14 | Ht 63.0 in | Wt 170.8 lb

## 2019-12-02 DIAGNOSIS — Z1239 Encounter for other screening for malignant neoplasm of breast: Secondary | ICD-10-CM

## 2019-12-02 DIAGNOSIS — M898X9 Other specified disorders of bone, unspecified site: Secondary | ICD-10-CM

## 2019-12-02 DIAGNOSIS — K5909 Other constipation: Secondary | ICD-10-CM

## 2019-12-02 DIAGNOSIS — F419 Anxiety disorder, unspecified: Secondary | ICD-10-CM

## 2019-12-02 DIAGNOSIS — Z0001 Encounter for general adult medical examination with abnormal findings: Secondary | ICD-10-CM | POA: Diagnosis not present

## 2019-12-02 DIAGNOSIS — F321 Major depressive disorder, single episode, moderate: Secondary | ICD-10-CM

## 2019-12-02 DIAGNOSIS — F5105 Insomnia due to other mental disorder: Secondary | ICD-10-CM

## 2019-12-02 DIAGNOSIS — R5383 Other fatigue: Secondary | ICD-10-CM

## 2019-12-02 DIAGNOSIS — Z0000 Encounter for general adult medical examination without abnormal findings: Secondary | ICD-10-CM

## 2019-12-02 MED ORDER — FLUOXETINE HCL 40 MG PO CAPS: 40.0000 mg | ORAL_CAPSULE | Freq: Every day | ORAL | 1 refills | Status: DC

## 2019-12-02 MED ORDER — ALPRAZOLAM 0.5 MG PO TABS: 0.5000 mg | ORAL_TABLET | Freq: Two times a day (BID) | ORAL | 5 refills | Status: DC | PRN

## 2019-12-02 NOTE — Patient Instructions (Signed)

## 2019-12-02 NOTE — Progress Notes (Signed)
Patient ID: Hayley Lynn, female    DOB: 07/01/1961  Age: 59 y.o. MRN: [id number]  The patient is here for   PREVENTIVE EXAM AND management of other chronic and acute problems.  This visit occurred during the SARS-CoV-2 public health emergency.  Safety protocols were in place, including screening questions prior to the visit, additional usage of staff PPE, and extensive cleaning of exam room while observing appropriate contact time as indicated for disinfecting solutions.     The risk factors are reflected in the social history.  The roster of all physicians providing medical care to patient - is listed in the Snapshot section of the chart.  Activities of daily living:  The patient is 100% independent in all ADLs: dressing, toileting, feeding as well as independent mobility  Home safety : The patient has smoke detectors in the home. They wear seatbelts.  There are no firearms at home. There is no violence in the home.   There is no risks for hepatitis, STDs or HIV. There is no   history of blood transfusion. They have no travel history to infectious disease endemic areas of the world.  The patient has seen their dentist in the last six month. They have seen their eye doctor in the last year.  They do not  have excessive sun exposure. Discussed the need for sun protection: hats, long sleeves and use of sunscreen if there is significant sun exposure.   Diet: the importance of a healthy diet is discussed. They do have a healthy diet.  The benefits of regular aerobic exercise were discussed. She is not exercising currently s.   Depression screen: there are multiple signs or vegative symptoms of depression- irritability, fatigue, anhedonia, sadness/tearfullness.  Cognitive assessment: the patient manages all their financial and personal affairs and is actively engaged. They could relate day,date,year and events; recalled 2/3 objects at 3 minutes; performed clock-face test normally.  The  following portions of the patient's history were reviewed and updated as appropriate: allergies, current medications, past family history, past medical history,  past surgical history, past social history  and problem list.  Visual acuity was not assessed per patient preference since she has regular follow up with her ophthalmologist. Hearing and body mass index were assessed and reviewed.   During the course of the visit the patient was educated and counseled about appropriate screening and preventive services including : fall prevention , diabetes screening, nutrition counseling, colorectal cancer screening, and recommended immunizations.    CC: There were no encounter diagnoses.  1) Depression:  Patient endorses depression with anhedonia,  Despair,  Widespread pain, sadness and is tearful today.  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  Tired of commuting to [place]. Has been largely neglecting her prayer life and physical health,  Not exercising. Having occasional panic attacks in addition to chronic insomnia    History Hayley Lynn has a past medical history of Arthritis, Blood in stool, Chicken pox, Depression, Diverticulitis, Headache, PONV (postoperative nausea and vomiting), Squamous cell skin cancer, breast, female (10/2017), and Wears contact lenses.   She has a past surgical history that includes Appendectomy; Abdominal hysterectomy; Colonoscopy; Colonoscopy with propofol (N/A, 12/30/2016); and Breast cyst aspiration (Right, 2004).   Her family history includes Alcohol abuse in her mother; Arthritis in her father, mother, paternal grandfather, and paternal grandmother; Diabetes in her brother; Heart disease in her father;  Hyperlipidemia in her father and mother; Hypertension in her mother, paternal grandfather, and paternal grandmother; Stroke in her mother and paternal grandfather.She reports  that she has never smoked. She has never used smokeless tobacco. She reports current alcohol use. She reports that she does not use drugs.  Outpatient Medications Prior to Visit  Medication Sig Dispense Refill  . ALPRAZolam (XANAX) 0.5 MG tablet Take 1 tablet (0.5 mg total) by mouth at bedtime as needed. 30 tablet 5  . estradiol (ESTRACE) 1 MG tablet Take 1.5 tablets (1.5 mg total) by mouth daily. 90 tablet 0  . eszopiclone (LUNESTA) 2 MG TABS tablet Take 1 tablet (2 mg total) by mouth at bedtime. Take immediately before bedtime 30 tablet 0  . FLUoxetine (PROZAC) 20 MG capsule Take 1 capsule (20 mg total) by mouth daily. 90 capsule 0  . valACYclovir (VALTREX) 1000 MG tablet Take 1 tablet (1,000 mg total) by mouth 3 (three) times daily. 21 tablet 3  . benzonatate (TESSALON PERLES) 100 MG capsule Take 1 capsule (100 mg total) by mouth 3 (three) times daily as needed. 20 capsule 0  . conjugated estrogens (PREMARIN) vaginal cream 2 grams intravaginally each night for 2 weeks, then twice weekly thereafter 42.5 g 12   No facility-administered medications prior to visit.    Review of Systems   Patient denies headache, fevers, malaise, unintentional weight loss, skin rash, eye pain, sinus congestion and sinus pain, sore throat, dysphagia,  hemoptysis , cough, dyspnea, wheezing, chest pain, palpitations, orthopnea, edema, abdominal pain, nausea, melena, diarrhea, constipation, flank pain, dysuria, hematuria, urinary  Frequency, nocturia, numbness, tingling, seizures,  Focal weakness, Loss of consciousness,  Tremor, insomnia, depression, anxiety, and suicidal ideation.      Objective:  BP 104/72 (BP Location: Left Arm, Patient Position: Sitting, Cuff Size: Normal)   Pulse 74   Temp (!) 96.2 F (35.7 C) (Temporal)   Resp 14   Ht 5\' 3"  (1.6 m)   Wt 170 lb 12.8 oz (77.5 kg)   SpO2 97%   BMI 30.26 kg/m   Physical Exam   General appearance: alert, cooperative and appears stated age Head:  Normocephalic, without obvious abnormality, atraumatic Eyes: conjunctivae/corneas clear. PERRL, EOM's intact. Fundi benign. Ears: normal TM's and external ear canals both ears Nose: Nares normal. Septum midline. Mucosa normal. No drainage or sinus tenderness. Throat: lips, mucosa, and tongue normal; teeth and gums normal Neck: no adenopathy, no carotid bruit, no JVD, supple, symmetrical, trachea midline and thyroid not enlarged, symmetric, no tenderness/mass/nodules Lungs: clear to auscultation bilaterally Breasts: normal appearance, no masses or tenderness Heart: regular rate and rhythm, S1, S2 normal, no murmur, click, rub or gallop Abdomen: soft, non-tender; bowel sounds normal; no masses,  no organomegaly Extremities: extremities normal, atraumatic, no cyanosis or edema Pulses: 2+ and symmetric Skin: Skin color, texture, turgor normal. No rashes or lesions Neurologic: Alert and oriented X 3, normal strength and tone. Normal symmetric reflexes. Normal coordination and gait.   Psych: affect sad, makes good eye contact. No fidgeting,  Cries  easily.  Denies current suicidal thoughts but admits that she had a moment in the parking garage at work where she considered it while on [place] ("to save charlie the mess")      Assessment & Plan:   Problem List Items Addressed This Visit    None      I have discontinued Hayley Lynn's conjugated estrogens and benzonatate. I am also having her maintain her ALPRAZolam, valACYclovir, FLUoxetine, estradiol, and  eszopiclone.  No orders of the defined types were placed in this encounter.   Medications Discontinued During This Encounter  Medication Reason  . conjugated estrogens (PREMARIN) vaginal cream Completed Course  . benzonatate (TESSALON PERLES) 100 MG capsule Completed Course    Follow-up: No follow-ups on file.   Crecencio Mc, MD

## 2019-12-03 ENCOUNTER — Telehealth: Payer: Self-pay | Admitting: Internal Medicine

## 2019-12-03 LAB — CBC WITH DIFFERENTIAL/PLATELET
Basophils Absolute: 0.1 10*3/uL (ref 0.0–0.1)
Basophils Relative: 0.8 % (ref 0.0–3.0)
Eosinophils Absolute: 0.1 10*3/uL (ref 0.0–0.7)
Eosinophils Relative: 1.7 % (ref 0.0–5.0)
HCT: 37.2 % (ref 36.0–46.0)
Hemoglobin: 12.6 g/dL (ref 12.0–15.0)
Lymphocytes Relative: 32.5 % (ref 12.0–46.0)
Lymphs Abs: 2.4 10*3/uL (ref 0.7–4.0)
MCHC: 33.9 g/dL (ref 30.0–36.0)
MCV: 92.9 fl (ref 78.0–100.0)
Monocytes Absolute: 0.5 10*3/uL (ref 0.1–1.0)
Monocytes Relative: 6.4 % (ref 3.0–12.0)
Neutro Abs: 4.4 10*3/uL (ref 1.4–7.7)
Neutrophils Relative %: 58.6 % (ref 43.0–77.0)
Platelets: 373 10*3/uL (ref 150.0–400.0)
RBC: 4.01 Mil/uL (ref 3.87–5.11)
RDW: 12.7 % (ref 11.5–15.5)
WBC: 7.5 10*3/uL (ref 4.0–10.5)

## 2019-12-03 LAB — COMPREHENSIVE METABOLIC PANEL
ALT: 10 U/L (ref 0–35)
AST: 18 U/L (ref 0–37)
Albumin: 3.7 g/dL (ref 3.5–5.2)
Alkaline Phosphatase: 55 U/L (ref 39–117)
BUN: 9 mg/dL (ref 6–23)
CO2: 26 mEq/L (ref 19–32)
Calcium: 8.7 mg/dL (ref 8.4–10.5)
Chloride: 105 mEq/L (ref 96–112)
Creatinine, Ser: 0.86 mg/dL (ref 0.40–1.20)
GFR: 67.41 mL/min (ref >60.00)
Glucose, Bld: 102 mg/dL — ABNORMAL HIGH (ref 70–99)
Potassium: 3.7 mEq/L (ref 3.5–5.1)
Sodium: 138 mEq/L (ref 135–145)
Total Bilirubin: 0.4 mg/dL (ref 0.2–1.2)
Total Protein: 7 g/dL (ref 6.0–8.3)

## 2019-12-03 LAB — TSH: TSH: 2.21 u[IU]/mL (ref 0.35–4.50)

## 2019-12-03 LAB — VITAMIN D 25 HYDROXY (VIT D DEFICIENCY, FRACTURES): VITD: 35.6 ng/mL (ref 30.00–100.00)

## 2019-12-03 LAB — SEDIMENTATION RATE: Sed Rate: 29 mm/hr (ref 0–30)

## 2019-12-03 NOTE — Assessment & Plan Note (Signed)

## 2019-12-03 NOTE — Assessment & Plan Note (Addendum)
Aggravated by Trumps' defeat,  THE COVID PANDEMIC,  And her stressful job.  Increase prozac dose to 40 mg daily.  Alprazolam refilled at $#60 qty for prn use  given new onset panic attacks.  The risks and benefits of benzodiazepine use were discussed with patient today including excessive sedation leading to respiratory depression,  impaired thinking/driving, and addiction.  Patient was advised to avoid concurrent use with alcohol, to use medication only as needed and not to share with others  .  Follow up in 2 weeks via mychart.

## 2019-12-03 NOTE — Assessment & Plan Note (Signed)
Managed with lunesta but only given 15/month,  Using Alprazolam for the other night . The risks and benefits of benzodiazepine use were reviewed  with patient today including excessive sedation leading to respiratory depression,  impaired thinking/driving, and addiction.  Patient is a registered RN and  was advised to avoid concurrent use with alcohol, to use medication only as needed and not to share with others  .       

## 2019-12-03 NOTE — Assessment & Plan Note (Addendum)
She has lumpy breasts on exam,  But nothing suspicious.  Reviewed Oct 2020 films,  Fibrocystic breasts.  Continue 3d screening

## 2019-12-14 ENCOUNTER — Other Ambulatory Visit: Payer: Self-pay

## 2019-12-15 ENCOUNTER — Other Ambulatory Visit: Payer: Self-pay | Admitting: Internal Medicine

## 2019-12-15 MED ORDER — ESTRADIOL 1 MG PO TABS: 1.5000 mg | ORAL_TABLET | Freq: Every day | ORAL | 0 refills | Status: DC

## 2019-12-16 MED ORDER — ESZOPICLONE 2 MG PO TABS: 2.0000 mg | ORAL_TABLET | Freq: Every day | ORAL | 5 refills | Status: DC

## 2019-12-16 NOTE — Telephone Encounter (Signed)
Refilled: 12/16/2019

## 2020-01-03 NOTE — Telephone Encounter (Signed)
Pt called returning your call 

## 2020-01-03 NOTE — Telephone Encounter (Signed)
LMTCB

## 2020-02-28 ENCOUNTER — Other Ambulatory Visit: Payer: Self-pay

## 2020-02-28 MED ORDER — ESTRADIOL 1 MG PO TABS: 1.5000 mg | ORAL_TABLET | Freq: Every day | ORAL | 0 refills | Status: DC

## 2020-04-25 ENCOUNTER — Other Ambulatory Visit: Payer: Self-pay

## 2020-04-27 MED ORDER — ESTRADIOL 1 MG PO TABS: 1.5000 mg | ORAL_TABLET | Freq: Every day | ORAL | 0 refills | Status: DC

## 2020-06-08 MED ORDER — FLUOXETINE HCL 40 MG PO CAPS: 40.0000 mg | ORAL_CAPSULE | Freq: Every day | ORAL | 1 refills | Status: DC

## 2020-06-17 MED ORDER — ESZOPICLONE 2 MG PO TABS: 2.0000 mg | ORAL_TABLET | Freq: Every day | ORAL | 5 refills | Status: DC

## 2020-06-17 NOTE — Addendum Note (Signed)
Addended by: Crecencio Mc on: 06/17/2020 09:40 PM   Modules accepted: Orders

## 2020-06-24 ENCOUNTER — Encounter: Payer: Self-pay | Admitting: Internal Medicine

## 2020-06-24 ENCOUNTER — Other Ambulatory Visit: Payer: Self-pay

## 2020-06-24 ENCOUNTER — Ambulatory Visit (INDEPENDENT_AMBULATORY_CARE_PROVIDER_SITE_OTHER): Payer: BC Managed Care – PPO | Admitting: Internal Medicine

## 2020-06-24 VITALS — BP 104/60 | HR 72 | Temp 98.1°F | Resp 14 | Ht 63.0 in | Wt 137.6 lb

## 2020-06-24 DIAGNOSIS — E785 Hyperlipidemia, unspecified: Secondary | ICD-10-CM | POA: Diagnosis not present

## 2020-06-24 DIAGNOSIS — F5105 Insomnia due to other mental disorder: Secondary | ICD-10-CM

## 2020-06-24 DIAGNOSIS — F341 Dysthymic disorder: Secondary | ICD-10-CM

## 2020-06-24 DIAGNOSIS — E663 Overweight: Secondary | ICD-10-CM | POA: Diagnosis not present

## 2020-06-24 DIAGNOSIS — R5383 Other fatigue: Secondary | ICD-10-CM

## 2020-06-24 DIAGNOSIS — R202 Paresthesia of skin: Secondary | ICD-10-CM | POA: Diagnosis not present

## 2020-06-24 DIAGNOSIS — E1169 Type 2 diabetes mellitus with other specified complication: Secondary | ICD-10-CM | POA: Diagnosis not present

## 2020-06-24 DIAGNOSIS — F419 Anxiety disorder, unspecified: Secondary | ICD-10-CM

## 2020-06-27 DIAGNOSIS — R5383 Other fatigue: Secondary | ICD-10-CM | POA: Insufficient documentation

## 2020-06-27 DIAGNOSIS — F341 Dysthymic disorder: Secondary | ICD-10-CM | POA: Insufficient documentation

## 2020-06-27 NOTE — Assessment & Plan Note (Addendum)
She is frustrated by the political climate at [facility] that has made the workplace oppressive for heterosexual white females .  Discussed ways to cope with the pressures of work.

## 2020-06-27 NOTE — Progress Notes (Signed)
Subjective:  Patient ID: Hayley Lynn, female    DOB: 29-Jan-1961  Age: 59 y.o. MRN: [id number]  CC: The primary encounter diagnosis was Hyperlipidemia associated with type 2 diabetes mellitus ([place]). Diagnoses of Dyslipidemia, Tingling in extremities, Overweight (BMI 25.0-29.9), Insomnia secondary to anxiety, Dysthymia, and Fatigue, unspecified type were also pertinent to this visit.  HPI [name] presents for follow up on multiple issues including insomnia managed with ambien.  This visit occurred during the SARS-CoV-2 public health emergency.  Safety protocols were in place, including screening questions prior to the visit, additional usage of staff PPE, and extensive cleaning of exam room while observing appropriate contact time as indicated for disinfecting solutions.   She feels generally well but has had increase fatigue.  [name]  Has lost 33 lbs,  20% of her body weight, using the [employer] Diet. Has not plateaued yet.     Insomnia:  Chronic, managed with Lunesta alternating with alprazolam. Refill history confirmed via [place] Controlled Substance databas, accessed by me today..  Outpatient Medications Prior to Visit  Medication Sig Dispense Refill  . ALPRAZolam (XANAX) 0.5 MG tablet Take 1 tablet (0.5 mg total) by mouth 2 (two) times daily as needed. 60 tablet 5  . Calcium Carb-Cholecalciferol (CALCIUM 1000 + D PO) Take 1 capsule by mouth daily.    . clobetasol (TEMOVATE) 0.05 % external solution Apply topically 2 (two) times daily.    [name] estradiol (ESTRACE) 1 MG tablet Take 1.5 tablets (1.5 mg total) by mouth daily. 90 tablet 0  . eszopiclone (LUNESTA) 2 MG TABS tablet Take 1 tablet (2 mg total) by mouth at bedtime. Take immediately before bedtime 30 tablet 5  . FLUoxetine (PROZAC) 40 MG capsule Take 1 capsule (40 mg total) by mouth daily. 90 capsule 1  . Lactobacillus (PROBIOTIC ACIDOPHILUS PO) Take 1 capsule by mouth daily.    . valACYclovir (VALTREX) 1000 MG tablet Take 1 tablet  (1,000 mg total) by mouth 3 (three) times daily. 21 tablet 3  . Calcium Carb-Cholecalciferol (OYSTER SHELL CALCIUM) 500-400 MG-UNIT TABS  (Patient not taking: Reported on 06/24/2020)     No facility-administered medications prior to visit.    Review of Systems;  Patient denies headache, fevers, malaise, unintentional weight loss, skin rash, eye pain, sinus congestion and sinus pain, sore throat, dysphagia,  hemoptysis , cough, dyspnea, wheezing, chest pain, palpitations, orthopnea, edema, abdominal pain, nausea, melena, diarrhea, constipation, flank pain, dysuria, hematuria, urinary  Frequency, nocturia, numbness, tingling, seizures,  Focal weakness, Loss of consciousness,  Tremor, insomnia, depression, anxiety, and suicidal ideation.      Objective:  BP 104/60 (BP Location: Left Arm, Patient Position: Sitting, Cuff Size: Normal)   Pulse 72   Temp 98.1 F (36.7 C) (Oral)   Resp 14   Ht 5\' 3"  (1.6 m)   Wt 137 lb 9.6 oz (62.4 kg)   SpO2 96%   BMI 24.37 kg/m   BP Readings from Last 3 Encounters:  06/24/20 104/60  12/02/19 104/72  09/07/18 (!) 100/58    Wt Readings from Last 3 Encounters:  06/24/20 137 lb 9.6 oz (62.4 kg)  12/02/19 170 lb 12.8 oz (77.5 kg)  09/07/18 162 lb 6.4 oz (73.7 kg)    General appearance: alert, cooperative and appears stated age Ears: normal TM's and external ear canals both ears Throat: lips, mucosa, and tongue normal; teeth and gums normal Neck: no adenopathy, no carotid bruit, supple, symmetrical, trachea midline and thyroid not enlarged, symmetric, no tenderness/mass/nodules Back:  symmetric, no curvature. ROM normal. No CVA tenderness. Lungs: clear to auscultation bilaterally Heart: regular rate and rhythm, S1, S2 normal, no murmur, click, rub or gallop Abdomen: soft, non-tender; bowel sounds normal; no masses,  no organomegaly Pulses: 2+ and symmetric Skin: Skin color, texture, turgor normal. No rashes or lesions Lymph nodes: Cervical,  supraclavicular, and axillary nodes normal.  Lab Results  Component Value Date   HGBA1C 5.1 02/06/2018   HGBA1C 5.3 09/19/2013    Lab Results  Component Value Date   CREATININE 0.86 12/02/2019   CREATININE 0.77 02/06/2018   CREATININE 0.80 04/07/2017    Lab Results  Component Value Date   WBC 7.5 12/02/2019   HGB 12.6 12/02/2019   HCT 37.2 12/02/2019   PLT 373.0 12/02/2019   GLUCOSE 102 (H) 12/02/2019   CHOL 177 02/06/2018   TRIG 145.0 02/06/2018   HDL 65.70 02/06/2018   LDLDIRECT 117 03/25/2016   LDLCALC 82 02/06/2018   ALT 10 12/02/2019   AST 18 12/02/2019   NA 138 12/02/2019   K 3.7 12/02/2019   CL 105 12/02/2019   CREATININE 0.86 12/02/2019   BUN 9 12/02/2019   CO2 26 12/02/2019   TSH 2.21 12/02/2019   INR 0.9 02/22/2012   HGBA1C 5.1 02/06/2018    MM 3D SCREEN BREAST BILATERAL  Result Date: 09/09/2019 CLINICAL DATA:  Screening. EXAM: DIGITAL SCREENING BILATERAL MAMMOGRAM WITH TOMO AND CAD COMPARISON:  Previous exam(s). ACR Breast Density Category b: There are scattered areas of fibroglandular density. FINDINGS: There are no findings suspicious for malignancy. Images were processed with CAD. IMPRESSION: No mammographic evidence of malignancy. A result letter of this screening mammogram will be mailed directly to the patient. RECOMMENDATION: Screening mammogram in one year. (Code:SM-B-01Y) BI-RADS CATEGORY  1: Negative. Electronically Signed   By: Nolon Nations M.D.   On: 09/09/2019 16:24    Assessment & Plan:   Problem List Items Addressed This Visit      Unprioritized   Dysthymia    She is frustrated by the political climate at [facility] that has made the workplace oppressive for heterosexual white females .  Discussed ways to cope with the pressures of work.       Fatigue    Etiology unclear.  Will screen for thyroid, anemia,  hepatic and renal insufficiency, encourage regular exercise 5 days /week,  consider sleep study and cardiology evaluation if  Snoring  noted or exertional dyspnea reported        Insomnia secondary to anxiety    Managed with lunesta but only given 15/month,  Using Alprazolam for the other night . The risks and benefits of benzodiazepine use were reviewed  with patient today including excessive sedation leading to respiratory depression,  impaired thinking/driving, and addiction.  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            Overweight (BMI 25.0-29.9)    I have congratulated her in normalization of   BMI  using a low glycemic index diet and regular exercise a minimum of 5 days per week.         Other Visit Diagnoses    Hyperlipidemia associated with type 2 diabetes mellitus ([place])    -  Primary   Dyslipidemia       Relevant Orders   Lipid panel   Comprehensive metabolic panel   Tingling in extremities  Relevant Orders   Vitamin B12      I have discontinued Jasmynn B. Gabrielle's [employer] Calcium. I am also having her maintain her valACYclovir, ALPRAZolam, estradiol, FLUoxetine, eszopiclone, clobetasol, Calcium Carb-Cholecalciferol (CALCIUM 1000 + D PO), and Lactobacillus (PROBIOTIC ACIDOPHILUS PO).  No orders of the defined types were placed in this encounter.   Medications Discontinued During This Encounter  Medication Reason  . Calcium Carb-Cholecalciferol (OYSTER SHELL CALCIUM) 500-400 MG-UNIT TABS     Follow-up: No follow-ups on file.   Crecencio Mc, MD

## 2020-06-27 NOTE — Assessment & Plan Note (Addendum)
I have congratulated her in normalization of   BMI  using a low glycemic index diet and regular exercise a minimum of 5 days per week.

## 2020-06-27 NOTE — Assessment & Plan Note (Signed)
Etiology unclear.  Will screen for thyroid, anemia,  hepatic and renal insufficiency, encourage regular exercise 5 days /week,  consider sleep study and cardiology evaluation if  Snoring noted or exertional dyspnea reported

## 2020-06-27 NOTE — Assessment & Plan Note (Signed)
Managed with lunesta but only given 15/month,  Using Alprazolam for the other night . The risks and benefits of benzodiazepine use were reviewed  with patient today including excessive sedation leading to respiratory depression,  impaired thinking/driving, and addiction.  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

## 2020-06-28 ENCOUNTER — Other Ambulatory Visit: Payer: Self-pay

## 2020-06-29 MED ORDER — ESTRADIOL 1 MG PO TABS: 1.5000 mg | ORAL_TABLET | Freq: Every day | ORAL | 0 refills | Status: DC

## 2020-07-16 ENCOUNTER — Other Ambulatory Visit: Payer: Self-pay

## 2020-07-16 ENCOUNTER — Other Ambulatory Visit (INDEPENDENT_AMBULATORY_CARE_PROVIDER_SITE_OTHER): Payer: BC Managed Care – PPO

## 2020-07-16 DIAGNOSIS — E785 Hyperlipidemia, unspecified: Secondary | ICD-10-CM

## 2020-07-16 DIAGNOSIS — R202 Paresthesia of skin: Secondary | ICD-10-CM | POA: Diagnosis not present

## 2020-07-16 LAB — COMPREHENSIVE METABOLIC PANEL
ALT: 11 U/L (ref 0–35)
AST: 22 U/L (ref 0–37)
Albumin: 3.5 g/dL (ref 3.5–5.2)
Alkaline Phosphatase: 45 U/L (ref 39–117)
BUN: 10 mg/dL (ref 6–23)
CO2: 30 mEq/L (ref 19–32)
Calcium: 8.4 mg/dL (ref 8.4–10.5)
Chloride: 105 mEq/L (ref 96–112)
Creatinine, Ser: 0.75 mg/dL (ref 0.40–1.20)
GFR: 78.78 mL/min (ref >60.00)
Glucose, Bld: 86 mg/dL (ref 70–99)
Potassium: 3.6 mEq/L (ref 3.5–5.1)
Sodium: 140 mEq/L (ref 135–145)
Total Bilirubin: 0.3 mg/dL (ref 0.2–1.2)
Total Protein: 5.8 g/dL — ABNORMAL LOW (ref 6.0–8.3)

## 2020-07-16 LAB — LIPID PANEL
Cholesterol: 175 mg/dL (ref 0–200)
HDL: 51.3 mg/dL (ref >39.00)
LDL Cholesterol: 102 mg/dL — ABNORMAL HIGH (ref 0–99)
NonHDL: 123.69
Total CHOL/HDL Ratio: 3
Triglycerides: 110 mg/dL (ref 0.0–149.0)
VLDL: 22 mg/dL (ref 0.0–40.0)

## 2020-07-16 LAB — VITAMIN B12: Vitamin B-12: 339 pg/mL (ref 211–911)

## 2020-07-29 MED ORDER — ALPRAZOLAM 0.5 MG PO TABS
0.5000 mg | ORAL_TABLET | Freq: Two times a day (BID) | ORAL | 5 refills | Status: DC | PRN
Start: 2020-07-29 — End: 2021-02-13

## 2020-08-03 ENCOUNTER — Other Ambulatory Visit: Payer: Self-pay

## 2020-08-03 ENCOUNTER — Other Ambulatory Visit: Payer: BC Managed Care – PPO

## 2020-08-03 DIAGNOSIS — Z20822 Contact with and (suspected) exposure to covid-19: Secondary | ICD-10-CM

## 2020-08-04 LAB — NOVEL CORONAVIRUS, NAA: SARS-CoV-2, NAA: NOT DETECTED

## 2020-08-04 LAB — SARS-COV-2, NAA 2 DAY TAT

## 2020-08-30 ENCOUNTER — Other Ambulatory Visit: Payer: Self-pay

## 2020-08-31 MED ORDER — ESTRADIOL 1 MG PO TABS
1.5000 mg | ORAL_TABLET | Freq: Every day | ORAL | 1 refills | Status: DC
Start: 2020-08-31 — End: 2021-01-06

## 2020-12-15 ENCOUNTER — Other Ambulatory Visit: Payer: Self-pay

## 2020-12-15 NOTE — Telephone Encounter (Signed)
Patient requests a refill of Lunesta and Prozac. Medication last refilled 06/17/2020 for Lunesta and 06/08/2020 for Prozac.

## 2020-12-15 NOTE — Telephone Encounter (Signed)
Refill Request has been sent to Dr. Derrel Nip for controlled medication and Prozac.

## 2020-12-17 MED ORDER — ESZOPICLONE 2 MG PO TABS
2.0000 mg | ORAL_TABLET | Freq: Every day | ORAL | 5 refills | Status: DC
Start: 2020-12-17 — End: 2021-06-14

## 2020-12-17 MED ORDER — FLUOXETINE HCL 40 MG PO CAPS
40.0000 mg | ORAL_CAPSULE | Freq: Every day | ORAL | 1 refills | Status: DC
Start: 2020-12-17 — End: 2021-06-14

## 2021-01-06 MED ORDER — ESTRADIOL 1 MG PO TABS
1.5000 mg | ORAL_TABLET | Freq: Every day | ORAL | 1 refills | Status: DC
Start: 2021-01-06 — End: 2021-04-14

## 2021-01-15 MED ORDER — BENZONATATE 200 MG PO CAPS: 200.0000 mg | ORAL_CAPSULE | Freq: Two times a day (BID) | ORAL | 0 refills | Status: DC | PRN

## 2021-02-12 ENCOUNTER — Other Ambulatory Visit: Payer: Self-pay | Admitting: Internal Medicine

## 2021-02-12 NOTE — Telephone Encounter (Signed)
RX Refill:xanax Last Seen:06-24-20 Last ordered:07-29-20

## 2021-02-25 ENCOUNTER — Ambulatory Visit
Admission: RE | Admit: 2021-02-25 | Discharge: 2021-02-25 | Disposition: A | Discharge status: Home / Self Care | Payer: BC Managed Care – PPO | Source: Ambulatory Visit | Attending: Emergency Medicine | Admitting: Emergency Medicine

## 2021-02-25 ENCOUNTER — Other Ambulatory Visit: Payer: Self-pay

## 2021-02-25 VITALS — BP 118/78 | HR 79 | Temp 98.3°F | Resp 18

## 2021-02-25 DIAGNOSIS — J0100 Acute maxillary sinusitis, unspecified: Secondary | ICD-10-CM | POA: Diagnosis not present

## 2021-02-25 MED ORDER — AMOXICILLIN 875 MG PO TABS: 875.0000 mg | ORAL_TABLET | Freq: Two times a day (BID) | ORAL | 0 refills | Status: AC

## 2021-02-25 NOTE — ED Triage Notes (Signed)
Pt presents with sinus pressure, fatigue, congestion x 1 week.  Had been coughing and used [employer] which helped and now she no longer needs them.  Denies fever, SOB.

## 2021-02-25 NOTE — Discharge Instructions (Signed)
Take the amoxicillin as directed.  Follow up with your primary care provider if your symptoms are not improving.   ° ° °

## 2021-02-25 NOTE — ED Provider Notes (Signed)
Hayley Lynn    CSN: [id number] Arrival date & time: 02/25/21  1458      History   Chief Complaint Chief Complaint  Patient presents with  . Facial Pain    HPI Hayley Lynn is a 60 y.o. female.   Patient presents with 1 week history of sinus congestion, sinus pressure, postnasal drip, fatigue, nonproductive cough.  She denies fever, chills, rash, sore throat, shortness of breath, vomiting, diarrhea, or other symptoms.  She has been treating her symptoms at home with [facility].  Her medical history includes squamous cell skin cancer, insomnia, chronic constipation, arthritis.  The history is provided by the patient and medical records.    Past Medical History:  Diagnosis Date  . Arthritis    hands  . Blood in stool   . Chicken pox   . Depression   . Diverticulitis   . Headache    stress related  . PONV (postoperative nausea and vomiting)    after hysterectomy  . Squamous cell skin cancer, breast, female 10/2017  . Wears contact lenses     Patient Active Problem List   Diagnosis Date Noted  . Dysthymia 06/27/2020  . Fatigue 06/27/2020  . History of hysterectomy, supracervical 02/06/2018  . Squamous cell skin cancer, breast, female 11/16/2017  . Hx of colonic polyps   . Overweight (BMI 25.0-29.9) 05/05/2015  . Postmenopausal atrophic vaginitis 07/28/2014  . Cystocele 02/20/2014  . Bladder cystocele 02/20/2014  . 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 Hernia, rectovaginal 02/20/2014  . Routine general medical examination at a health care facility 02/28/2013  . Insomnia secondary to anxiety 09/08/2012  . Breast screening 09/08/2012  . Chronic constipation 09/08/2012    Past Surgical History:  Procedure Laterality Date  . ABDOMINAL HYSTERECTOMY    . APPENDECTOMY    . BREAST CYST ASPIRATION Right 2004  . COLONOSCOPY    . COLONOSCOPY WITH PROPOFOL N/A 12/30/2016   Procedure: COLONOSCOPY WITH PROPOFOL;  Surgeon: Lucilla Lame, MD;  Location:  [facility];  Service: Endoscopy;  Laterality: N/A;    OB History   No obstetric history on file.      Home Medications    Prior to Admission medications   Medication Sig Start Date End Date Taking? Authorizing Provider  ALPRAZolam Duanne Moron) 0.5 MG tablet TAKE ONE TABLET BY MOUTH TWICE A DAY AS NEEDED 02/13/21  Yes Crecencio Mc, MD  amoxicillin (AMOXIL) 875 MG tablet Take 1 tablet (875 mg total) by mouth 2 (two) times daily for 7 days. 02/25/21 03/04/21 Yes Sharion Balloon, NP  benzonatate (TESSALON) 200 MG capsule Take 1 capsule (200 mg total) by mouth 2 (two) times daily as needed for cough. 01/15/21  Yes Crecencio Mc, MD  Calcium Carb-Cholecalciferol (CALCIUM 1000 + D PO) Take 1 capsule by mouth daily.   Yes [provider]  eszopiclone (LUNESTA) 2 MG TABS tablet Take 1 tablet (2 mg total) by mouth at bedtime. Take immediately before bedtime 12/17/20  Yes Crecencio Mc, MD  FLUoxetine (PROZAC) 40 MG capsule Take 1 capsule (40 mg total) by mouth daily. 12/17/20  Yes Crecencio Mc, MD  Lactobacillus (PROBIOTIC ACIDOPHILUS PO) Take 1 capsule by mouth daily.   Yes [provider]  clobetasol (TEMOVATE) 0.05 % external solution Apply topically 2 (two) times daily. 05/14/20   [provider]  estradiol (ESTRACE) 1 MG tablet Take 1.5 tablets (1.5 mg total) by mouth daily. 01/06/21   Crecencio Mc, MD  valACYclovir (VALTREX) 1000 MG tablet Take 1 tablet (1,000 mg total) by mouth 3 (three) times daily. 06/16/19   Crecencio Mc, MD    Family History Family History  Problem Relation Age of Onset  . Alcohol abuse Mother   . Arthritis Mother   . Hyperlipidemia Mother   . Stroke Mother   . Hypertension Mother   . Arthritis Father   . Hyperlipidemia Father   . Heart disease Father   . Diabetes Brother   . Arthritis Paternal Grandmother   . Hypertension Paternal Grandmother   . Arthritis Paternal Grandfather   . Hypertension Paternal Grandfather   . Stroke  Paternal Grandfather   . Breast cancer Neg Hx     Social History Social History   Tobacco Use  . Smoking status: Never Smoker  . Smokeless tobacco: Never Used  Vaping Use  . Vaping Use: Never used  Substance Use Topics  . Alcohol use: Yes    Comment: 1-2 drinks/month  . Drug use: No     Allergies   Capparis spinosa and Sulfa antibiotics   Review of Systems Review of Systems  Constitutional: Negative for chills and fever.  HENT: Positive for congestion, postnasal drip and sinus pressure. Negative for ear pain and sore throat.   Eyes: Negative for pain and visual disturbance.  Respiratory: Positive for cough. Negative for shortness of breath.   Cardiovascular: Negative for chest pain and palpitations.  Gastrointestinal: Negative for abdominal pain, diarrhea and vomiting.  Genitourinary: Negative for dysuria and hematuria.  Musculoskeletal: Negative for arthralgias and back pain.  Skin: Negative for color change and rash.  Neurological: Negative for seizures and syncope.  All other systems reviewed and are negative.    Physical Exam Triage Vital Signs ED Triage Vitals  Enc Vitals Group     BP      Pulse      Resp      Temp      Temp src      SpO2      Weight      Height      Head Circumference      Peak Flow      Pain Score      Pain Loc      Pain Edu?      Excl. in [place]?    No data found.  Updated Vital Signs BP 118/78 (BP Location: Left Arm)   Pulse 79   Temp 98.3 F (36.8 C) (Oral)   Resp 18   SpO2 96%   Visual Acuity Right Eye Distance:   Left Eye Distance:   Bilateral Distance:    Right Eye Near:   Left Eye Near:    Bilateral Near:     Physical Exam Vitals and nursing note reviewed.  Constitutional:      General: She is not in acute distress.    Appearance: She is well-developed. She is not ill-appearing.  HENT:     Head: Normocephalic and atraumatic.     Right Ear: Tympanic membrane normal.     Left Ear: Tympanic membrane normal.      Nose: Congestion present.     Mouth/Throat:     Mouth: Mucous membranes are moist.     Pharynx: Oropharynx is clear.  Eyes:     Conjunctiva/sclera: Conjunctivae normal.  Cardiovascular:     Rate and Rhythm: Normal rate and regular rhythm.     Heart sounds: Normal heart sounds.  Pulmonary:     Effort:  Pulmonary effort is normal. No respiratory distress.     Breath sounds: Normal breath sounds.  Abdominal:     Palpations: Abdomen is soft.     Tenderness: There is no abdominal tenderness.  Musculoskeletal:     Cervical back: Neck supple.  Skin:    General: Skin is warm and dry.  Neurological:     General: No focal deficit present.     Mental Status: She is alert and oriented to person, place, and time.     Gait: Gait normal.  Psychiatric:        Mood and Affect: Mood normal.        Behavior: Behavior normal.      UC Treatments / Results  Labs (all labs ordered are listed, but only abnormal results are displayed) Labs Reviewed - No data to display  EKG   Radiology No results found.  Procedures Procedures (including critical care time)  Medications Ordered in UC Medications - No data to display  Initial Impression / Assessment and Plan / UC Course  I have reviewed the triage vital signs and the nursing notes.  Pertinent labs & imaging results that were available during my care of the patient were reviewed by me and considered in my medical decision making (see chart for details).   Acute sinusitis.  Patient declines COVID test today.  Treating with amoxicillin.  Instructed patient to follow-up with her PCP if her symptoms are not improving.  She agrees to plan of care.   Final Clinical Impressions(s) / UC Diagnoses   Final diagnoses:  Acute non-recurrent maxillary sinusitis     Discharge Instructions     Take the amoxicillin as directed.    Follow up with your primary care provider if your symptoms are not improving.        ED Prescriptions     Medication Sig Dispense Auth. Provider   amoxicillin (AMOXIL) 875 MG tablet Take 1 tablet (875 mg total) by mouth 2 (two) times daily for 7 days. 14 tablet Sharion Balloon, NP     PDMP not reviewed this encounter.   Sharion Balloon, NP 02/25/21 1539

## 2021-03-15 ENCOUNTER — Emergency Department
Admission: EM | Admit: 2021-03-15 | Discharge: 2021-03-15 | Disposition: A | Discharge status: Home / Self Care | Payer: BC Managed Care – PPO | Attending: Emergency Medicine | Admitting: Emergency Medicine

## 2021-03-15 ENCOUNTER — Other Ambulatory Visit: Payer: Self-pay | Admitting: Internal Medicine

## 2021-03-15 ENCOUNTER — Other Ambulatory Visit: Payer: Self-pay

## 2021-03-15 ENCOUNTER — Emergency Department: Payer: BC Managed Care – PPO

## 2021-03-15 DIAGNOSIS — R918 Other nonspecific abnormal finding of lung field: Secondary | ICD-10-CM | POA: Diagnosis not present

## 2021-03-15 DIAGNOSIS — M79661 Pain in right lower leg: Secondary | ICD-10-CM | POA: Insufficient documentation

## 2021-03-15 DIAGNOSIS — Z853 Personal history of malignant neoplasm of breast: Secondary | ICD-10-CM | POA: Insufficient documentation

## 2021-03-15 DIAGNOSIS — M79604 Pain in right leg: Secondary | ICD-10-CM

## 2021-03-15 LAB — BASIC METABOLIC PANEL
Anion gap: 9 (ref 5–15)
BUN: 17 mg/dL (ref 6–20)
CO2: 28 mmol/L (ref 22–32)
Calcium: 8.9 mg/dL (ref 8.9–10.3)
Chloride: 101 mmol/L (ref 98–111)
Creatinine, Ser: 0.56 mg/dL (ref 0.44–1.00)
GFR, Estimated: >60 mL/min (ref >60)
Glucose, Bld: 91 mg/dL (ref 70–99)
Potassium: 3.9 mmol/L (ref 3.5–5.1)
Sodium: 138 mmol/L (ref 135–145)

## 2021-03-15 LAB — CBC
HCT: 36.1 % (ref 36.0–46.0)
Hemoglobin: 12.3 g/dL (ref 12.0–15.0)
MCH: 32.2 pg (ref 26.0–34.0)
MCHC: 34.1 g/dL (ref 30.0–36.0)
MCV: 94.5 fL (ref 80.0–100.0)
Platelets: 305 10*3/uL (ref 150–400)
RBC: 3.82 MIL/uL — ABNORMAL LOW (ref 3.87–5.11)
RDW: 12.6 % (ref 11.5–15.5)
WBC: 7.8 10*3/uL (ref 4.0–10.5)
nRBC: 0 % (ref 0.0–0.2)

## 2021-03-15 LAB — TROPONIN I (HIGH SENSITIVITY): Troponin I (High Sensitivity): <2 ng/L (ref <18)

## 2021-03-15 MED ORDER — ETODOLAC 500 MG PO TABS: 500.0000 mg | ORAL_TABLET | Freq: Two times a day (BID) | ORAL | 0 refills | Status: DC

## 2021-03-15 NOTE — ED Triage Notes (Signed)
Pt ambulatory to triage.  Pt has right lower leg pain since yesterday.  Pt scared she has a blood clot.  No chest pain or sob.  Pt alert  Speech clear.

## 2021-03-15 NOTE — ED Provider Notes (Signed)
[facility] Emergency Department Provider Note  ____________________________________________  Time seen: Approximately 10:42 PM  I have reviewed the triage vital signs and the nursing notes.   HISTORY  Chief Complaint Leg Pain    HPI Hayley Lynn is a 60 y.o. female who presents the emergency department complaining of right lower leg pain starting yesterday.  Patient states that she had calf pain began yesterday that is migrated to posterior knee/medial thigh region.  Patient has no appreciable erythema or edema.  She states that she does have history of arthritis in her knees but this pain is different.  Patient has no bleeding or clotting disorders but is concerned that she may have a DVT given the presentation.  No other complaints currently.  Patient denies any chest pain, shortness of breath, palpitations.  No back pain.  No numbness or tingling of the foot.         Past Medical History:  Diagnosis Date  . Arthritis    hands  . Blood in stool   . Chicken pox   . Depression   . Diverticulitis   . Headache    stress related  . PONV (postoperative nausea and vomiting)    after hysterectomy  . Squamous cell skin cancer, breast, female 10/2017  . Wears contact lenses     Patient Active Problem List   Diagnosis Date Noted  . Dysthymia 06/27/2020  . Fatigue 06/27/2020  . History of hysterectomy, supracervical 02/06/2018  . Squamous cell skin cancer, breast, female 11/16/2017  . Hx of colonic polyps   . Overweight (BMI 25.0-29.9) 05/05/2015  . Postmenopausal atrophic vaginitis 07/28/2014  . Cystocele 02/20/2014  . Bladder cystocele 02/20/2014  . 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 Hernia, rectovaginal 02/20/2014  . Routine general medical examination at a health care facility 02/28/2013  . Insomnia secondary to anxiety 09/08/2012  . Breast screening 09/08/2012  . Chronic constipation 09/08/2012    Past Surgical History:  Procedure  Laterality Date  . ABDOMINAL HYSTERECTOMY    . APPENDECTOMY    . BREAST CYST ASPIRATION Right 2004  . COLONOSCOPY    . COLONOSCOPY WITH PROPOFOL N/A 12/30/2016   Procedure: COLONOSCOPY WITH PROPOFOL;  Surgeon: Lucilla Lame, MD;  Location: [place];  Service: Endoscopy;  Laterality: N/A;    Prior to Admission medications   Medication Sig Start Date End Date Taking? Authorizing Provider  etodolac (LODINE) 500 MG tablet Take 1 tablet (500 mg total) by mouth 2 (two) times daily. 03/15/21  Yes Takahiro Godinho, Charline Bills, PA-C  ALPRAZolam Duanne Moron) 0.5 MG tablet Take 1 tablet (0.5 mg total) by mouth at bedtime as needed for anxiety. 03/15/21   Crecencio Mc, MD  benzonatate (TESSALON) 200 MG capsule Take 1 capsule (200 mg total) by mouth 2 (two) times daily as needed for cough. 01/15/21   Crecencio Mc, MD  Calcium Carb-Cholecalciferol (CALCIUM 1000 + D PO) Take 1 capsule by mouth daily.    [provider]  clobetasol (TEMOVATE) 0.05 % external solution Apply topically 2 (two) times daily. 05/14/20   [provider]  estradiol (ESTRACE) 1 MG tablet Take 1.5 tablets (1.5 mg total) by mouth daily. 01/06/21   Crecencio Mc, MD  eszopiclone (LUNESTA) 2 MG TABS tablet Take 1 tablet (2 mg total) by mouth at bedtime. Take immediately before bedtime 12/17/20   Crecencio Mc, MD  FLUoxetine (PROZAC) 40 MG capsule Take 1 capsule (40 mg total) by mouth daily. 12/17/20  Crecencio Mc, MD  Lactobacillus (PROBIOTIC ACIDOPHILUS PO) Take 1 capsule by mouth daily.    [provider]  valACYclovir (VALTREX) 1000 MG tablet Take 1 tablet (1,000 mg total) by mouth 3 (three) times daily. 06/16/19   Crecencio Mc, MD    Allergies Capparis spinosa and Sulfa antibiotics  Family History  Problem Relation Age of Onset  . Alcohol abuse Mother   . Arthritis Mother   . Hyperlipidemia Mother   . Stroke Mother   . Hypertension Mother   . Arthritis Father   . Hyperlipidemia Father   . Heart  disease Father   . Diabetes Brother   . Arthritis Paternal Grandmother   . Hypertension Paternal Grandmother   . Arthritis Paternal Grandfather   . Hypertension Paternal Grandfather   . Stroke Paternal Grandfather   . Breast cancer Neg Hx     Social History Social History   Tobacco Use  . Smoking status: Never Smoker  . Smokeless tobacco: Never Used  Vaping Use  . Vaping Use: Never used  Substance Use Topics  . Alcohol use: Yes    Comment: 1-2 drinks/month  . Drug use: No     Review of Systems  Constitutional: No fever/chills Eyes: No visual changes. No discharge ENT: No upper respiratory complaints. Cardiovascular: no chest pain. Respiratory: no cough. No SOB. Gastrointestinal: No abdominal pain.  No nausea, no vomiting.  No diarrhea.  No constipation. Genitourinary: Negative for dysuria. No hematuria Musculoskeletal: Positive for right calf pain migrating into the posterior knee and medial thigh Skin: Negative for rash, abrasions, lacerations, ecchymosis. Neurological: Negative for headaches, focal weakness or numbness.  10 System ROS otherwise negative.  ____________________________________________   PHYSICAL EXAM:  VITAL SIGNS: ED Triage Vitals [03/15/21 2023]  Enc Vitals Group     BP 107/62     Pulse Rate 70     Resp 20     Temp 97.8 F (36.6 C)     Temp Source Oral     SpO2 99 %     Weight 138 lb (62.6 kg)     Height 5\' 3"  (1.6 m)     Head Circumference      Peak Flow      Pain Score 7     Pain Loc      Pain Edu?      Excl. in [place]?      Constitutional: Alert and oriented. Well appearing and in no acute distress. Eyes: Conjunctivae are normal. PERRL. EOMI. Head: Atraumatic. ENT:      Ears:       Nose: No congestion/rhinnorhea.      Mouth/Throat: Mucous membranes are moist.  Neck: No stridor.    Cardiovascular: Normal rate, regular rhythm. Normal S1 and S2.  Good peripheral circulation. Respiratory: Normal respiratory effort without  tachypnea or retractions. Lungs CTAB. Good air entry to the bases with no decreased or absent breath sounds. Musculoskeletal: Full range of motion to all extremities. No gross deformities appreciated.  Visualization of the right lower extremity reveals no gross erythema or edema.  Good range of motion to all joints.  Patient is mildly tender to palpation in the posterior knee fossa extending into the medial thigh.  No palpable findings in this area.  Again no erythema or edema.  Results pedis pulses sensation intact distally. Neurologic:  Normal speech and language. No gross focal neurologic deficits are appreciated.  Skin:  Skin is warm, dry and intact. No rash noted. Psychiatric: Mood and  affect are normal. Speech and behavior are normal. Patient exhibits appropriate insight and judgement.   ____________________________________________   LABS (all labs ordered are listed, but only abnormal results are displayed)  Labs Reviewed  CBC - Abnormal; Notable for the following components:      Result Value   RBC 3.82 (*)    All other components within normal limits  BASIC METABOLIC PANEL  TROPONIN I (HIGH SENSITIVITY)  TROPONIN I (HIGH SENSITIVITY)   ____________________________________________  EKG   ____________________________________________  RADIOLOGY I personally viewed and evaluated these images as part of my medical decision making, as well as reviewing the written report by the radiologist.  ED Provider Interpretation: No acute cardiopulmonary abnormality on chest x-ray.  Ultrasound reveals no evidence of DVT  DG Chest 2 View  Result Date: 03/15/2021 CLINICAL DATA:  Right lower leg pain. EXAM: CHEST - 2 VIEW COMPARISON:  July 28, 2014 FINDINGS: The lungs are hyperinflated. Mild, chronic appearing increased lung markings are seen. An ill-defined nipple shadow is suspected overlying the left lung base. There is no evidence of acute infiltrate, pleural effusion or pneumothorax.  The heart size and mediastinal contours are within normal limits. The visualized skeletal structures are unremarkable. IMPRESSION: Stable exam without active cardiopulmonary disease. Electronically Signed   By: Virgina Norfolk M.D.   On: 03/15/2021 20:43   US Venous Img Lower Unilateral Right  Result Date: 03/15/2021 CLINICAL DATA:  Calf pain EXAM: RIGHT LOWER EXTREMITY VENOUS DOPPLER ULTRASOUND TECHNIQUE: Gray-scale sonography with compression, as well as color and duplex ultrasound, were performed to evaluate the deep venous system(s) from the level of the common femoral vein through the popliteal and proximal calf veins. COMPARISON:  None. FINDINGS: VENOUS Normal compressibility of the common femoral, superficial femoral, and popliteal veins, as well as the visualized calf veins. Visualized portions of profunda femoral vein and great saphenous vein unremarkable. No filling defects to suggest DVT on grayscale or color Doppler imaging. Doppler waveforms show normal direction of venous flow, normal respiratory plasticity and response to augmentation. Limited views of the contralateral common femoral vein are unremarkable. OTHER None. Limitations: none IMPRESSION: Negative. Electronically Signed   By: Constance Holster M.D.   On: 03/15/2021 23:11    ____________________________________________    PROCEDURES  Procedure(s) performed:    Procedures    Medications - No data to display   ____________________________________________   INITIAL IMPRESSION / ASSESSMENT AND PLAN / ED COURSE  Pertinent labs & imaging results that were available during my care of the patient were reviewed by me and considered in my medical decision making (see chart for details).  Review of the [place] CSRS was performed in accordance of the [place] prior to dispensing any controlled drugs.           Patient's diagnosis is consistent with right leg pain.  Patient presented to the emergency department complaining of  right calf pain extending into the right thigh.  Patient states that pain has been transitory over the last 2 days.  No history of DVT or bleeding or clotting disorders.  No significant risk factors but given the pain, patient was evaluated with labs and imaging.  No evidence of DVT.  Remainder of exam is reassuring.  Patient will be prescribed anti-inflammatory for symptom relief.  Follow-up primary care as needed..  Patient is given ED precautions to return to the ED for any worsening or new symptoms.     ____________________________________________  FINAL CLINICAL IMPRESSION(S) / ED DIAGNOSES  Final diagnoses:  Right leg pain      NEW MEDICATIONS STARTED DURING THIS VISIT:  ED Discharge Orders         Ordered    etodolac (LODINE) 500 MG tablet  2 times daily        03/15/21 2326              This chart was dictated using voice recognition software/Dragon. Despite best efforts to proofread, errors can occur which can change the meaning. Any change was purely unintentional.    Darletta Moll, PA-C 03/15/21 2336    Naaman Plummer, MD 03/21/21 [phone]

## 2021-03-15 NOTE — Telephone Encounter (Signed)
RX Refill:xanax Last Seen:06-24-20 Last ordered:02-13-21

## 2021-04-13 ENCOUNTER — Other Ambulatory Visit: Payer: Self-pay | Admitting: Internal Medicine

## 2021-05-03 ENCOUNTER — Other Ambulatory Visit: Payer: Self-pay

## 2021-05-03 ENCOUNTER — Encounter: Payer: Self-pay | Admitting: Internal Medicine

## 2021-05-03 ENCOUNTER — Ambulatory Visit (INDEPENDENT_AMBULATORY_CARE_PROVIDER_SITE_OTHER): Payer: BC Managed Care – PPO | Admitting: Internal Medicine

## 2021-05-03 VITALS — BP 100/70 | HR 65 | Temp 97.8°F | Ht 63.0 in | Wt 139.6 lb

## 2021-05-03 DIAGNOSIS — F419 Anxiety disorder, unspecified: Secondary | ICD-10-CM | POA: Diagnosis not present

## 2021-05-03 DIAGNOSIS — Z79899 Other long term (current) drug therapy: Secondary | ICD-10-CM

## 2021-05-03 DIAGNOSIS — Z23 Encounter for immunization: Secondary | ICD-10-CM

## 2021-05-03 DIAGNOSIS — F5105 Insomnia due to other mental disorder: Secondary | ICD-10-CM

## 2021-05-03 DIAGNOSIS — Z1231 Encounter for screening mammogram for malignant neoplasm of breast: Secondary | ICD-10-CM

## 2021-05-03 DIAGNOSIS — E663 Overweight: Secondary | ICD-10-CM | POA: Diagnosis not present

## 2021-05-03 NOTE — Progress Notes (Signed)
Subjective:  Patient ID: Hayley Lynn, female    DOB: 03/05/1961  Age: 60 y.o. MRN: [id number]  CC: The primary encounter diagnosis was Encounter for screening mammogram for malignant neoplasm of breast. Diagnoses of COVID-19 vaccine administered, Long-term use of high-risk medication, Insomnia secondary to anxiety, and Overweight (BMI 25.0-29.9) were also pertinent to this visit.  HPI Hayley Lynn presents for medication refil and follow upon insomnia, dysthymic disorder and   This visit occurred during the SARS-CoV-2 public health emergency.  Safety protocols were in place, including screening questions prior to the visit, additional usage of staff PPE, and extensive cleaning of exam room while observing appropriate contact time as indicated for disinfecting solutions.   Recent ER visit:  reviewed reason for vision: sudden onset of right popliteal pain which moved to posterior medial thigh. Given her chronic estradiol use,  she wen to ER for DVT of RLE rule out whih was done with doppler in  early may. Symptoms resolved spontaneously   Had COVID  in February . [name]  Fatigue has resolved.  overweight: has lost 30 lbs with [employer] diet; keeping it off  Insomnia:  Lunesta dependent for years.  No trial of melatonin at bedtime.  Willing to go through a trial without it.   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  Now working entirely from home null time for [employer].    Outpatient Medications Prior to Visit  Medication Sig Dispense Refill   ALPRAZolam (XANAX) 0.5 MG tablet Take 1 tablet (0.5 mg total) by mouth at bedtime as needed for anxiety. 30 tablet 0   Calcium Carb-Cholecalciferol (CALCIUM 1000 + D PO) Take 1 capsule by mouth daily.     clobetasol (TEMOVATE) 0.05 % external solution Apply topically 2 (two) times daily.     estradiol (ESTRACE) 1 MG tablet TAKE ONE AND ONE-HALF TABLETS BY MOUTH ONE TIME DAILY 90 tablet 0   eszopiclone (LUNESTA) 2 MG TABS tablet Take  1 tablet (2 mg total) by mouth at bedtime. Take immediately before bedtime 30 tablet 5   FLUoxetine (PROZAC) 40 MG capsule Take 1 capsule (40 mg total) by mouth daily. 90 capsule 1   Lactobacillus (PROBIOTIC ACIDOPHILUS PO) Take 1 capsule by mouth daily.     valACYclovir (VALTREX) 1000 MG tablet Take 1 tablet (1,000 mg total) by mouth 3 (three) times daily. 21 tablet 3   benzonatate (TESSALON) 200 MG capsule Take 1 capsule (200 mg total) by mouth 2 (two) times daily as needed for cough. (Patient not taking: Reported on 05/03/2021) 60 capsule 0   etodolac (LODINE) 500 MG tablet Take 1 tablet (500 mg total) by mouth 2 (two) times daily. (Patient not taking: Reported on 05/03/2021) 60 tablet 0   No facility-administered medications prior to visit.    Review of Systems;  Patient denies headache, fevers, malaise, unintentional weight loss, skin rash, eye pain, sinus congestion and sinus pain, sore throat, dysphagia,  hemoptysis , cough, dyspnea, wheezing, chest pain, palpitations, orthopnea, edema, abdominal pain, nausea, melena, diarrhea, constipation, flank pain, dysuria, hematuria, urinary  Frequency, nocturia, numbness, tingling, seizures,  Focal weakness, Loss of consciousness,  Tremor, insomnia, depression, anxiety, and suicidal ideation.      Objective:  BP 100/70 (BP Location: Left Arm, Patient Position: Sitting, Cuff Size: Large)   Pulse 65   Temp 97.8 F (36.6 C) (Oral)   Ht 5\' 3"  (1.6 m)   Wt 139 lb 9.6 oz (63.3 kg)  SpO2 98%   BMI 24.73 kg/m   BP Readings from Last 3 Encounters:  05/03/21 100/70  03/15/21 110/65  02/25/21 118/78    Wt Readings from Last 3 Encounters:  05/03/21 139 lb 9.6 oz (63.3 kg)  03/15/21 138 lb (62.6 kg)  06/24/20 137 lb 9.6 oz (62.4 kg)    General appearance: alert, cooperative and appears stated age Ears: normal TM's and external ear canals both ears Throat: lips, mucosa, and tongue normal; teeth and gums normal Neck: no adenopathy, no carotid  bruit, supple, symmetrical, trachea midline and thyroid not enlarged, symmetric, no tenderness/mass/nodules Back: symmetric, no curvature. ROM normal. No CVA tenderness. Lungs: clear to auscultation bilaterally Heart: regular rate and rhythm, S1, S2 normal, no murmur, click, rub or gallop Abdomen: soft, non-tender; bowel sounds normal; no masses,  no organomegaly Pulses: 2+ and symmetric Skin: Skin color, texture, turgor normal. No rashes or lesions Lymph nodes: Cervical, supraclavicular, and axillary nodes normal.  Lab Results  Component Value Date   HGBA1C 5.1 02/06/2018   HGBA1C 5.3 09/19/2013    Lab Results  Component Value Date   CREATININE 0.70 05/03/2021   CREATININE 0.56 03/15/2021   CREATININE 0.75 07/16/2020    Lab Results  Component Value Date   WBC 7.8 03/15/2021   HGB 12.3 03/15/2021   HCT 36.1 03/15/2021   PLT 305 03/15/2021   GLUCOSE 81 05/03/2021   CHOL 175 07/16/2020   TRIG 110.0 07/16/2020   HDL 51.30 07/16/2020   LDLDIRECT 117 03/25/2016   LDLCALC 102 (H) 07/16/2020   ALT 15 05/03/2021   AST 22 05/03/2021   NA 137 05/03/2021   K 4.0 05/03/2021   CL 101 05/03/2021   CREATININE 0.70 05/03/2021   BUN 19 05/03/2021   CO2 30 05/03/2021   TSH 2.21 12/02/2019   INR 0.9 02/22/2012   HGBA1C 5.1 02/06/2018     Assessment & Plan:   Problem List Items Addressed This Visit       Unprioritized   Insomnia secondary to anxiety    I recommend trying melatonin for  Insomnia taken on  a regular basis to help your internal clock.  Take every evening after dinner start with 5 mg dose   Max effective dose is 10 mg        Overweight (BMI 25.0-29.9)    I have congratulated her in normalization of   BMI  Using the [place]  and regular exercise a minimum of 5 days per week.         Other Visit Diagnoses     Encounter for screening mammogram for malignant neoplasm of breast    -  Primary   Relevant Orders   MM 3D SCREEN BREAST BILATERAL   COVID-19  vaccine administered       Relevant Orders   SARS-CoV-2 Semi-Quantitative Total Antibody, Spike   Long-term use of high-risk medication       Relevant Orders   Comprehensive metabolic panel (Completed)      I provided  30 minutes of  face-to-face time during this encounter reviewing patient's current management of overweight and insomnia ,  past surgeries, labs and imaging studies, providing counseling on the above mentioned problems , and coordination  of care .   I have discontinued Adlyn Fife. Curet's etodolac. I am also having her maintain her valACYclovir, clobetasol, Calcium Carb-Cholecalciferol (CALCIUM 1000 + D PO), Lactobacillus (PROBIOTIC ACIDOPHILUS PO), eszopiclone, FLUoxetine, benzonatate, ALPRAZolam, and estradiol.  No orders of the defined types were  placed in this encounter.   Medications Discontinued During This Encounter  Medication Reason   etodolac (LODINE) 500 MG tablet     Follow-up: No follow-ups on file.   Crecencio Mc, MD

## 2021-05-03 NOTE — Patient Instructions (Signed)
IF YOU WANT TO TRY GETTING OFF OF LUNESTA:  I recommend starting  melatonin for your insomnia.  It is not a sedative,  But must be taken on  a regular basis to help your internal clock.  Take every evening after dinner start with 5 mg dose   Max effective dose is 10 mg

## 2021-05-04 LAB — COMPREHENSIVE METABOLIC PANEL
ALT: 15 U/L (ref 0–35)
AST: 22 U/L (ref 0–37)
Albumin: 4 g/dL (ref 3.5–5.2)
Alkaline Phosphatase: 44 U/L (ref 39–117)
BUN: 19 mg/dL (ref 6–23)
CO2: 30 mEq/L (ref 19–32)
Calcium: 9.1 mg/dL (ref 8.4–10.5)
Chloride: 101 mEq/L (ref 96–112)
Creatinine, Ser: 0.7 mg/dL (ref 0.40–1.20)
GFR: 93.64 mL/min (ref >60.00)
Glucose, Bld: 81 mg/dL (ref 70–99)
Potassium: 4 mEq/L (ref 3.5–5.1)
Sodium: 137 mEq/L (ref 135–145)
Total Bilirubin: 0.5 mg/dL (ref 0.2–1.2)
Total Protein: 6.8 g/dL (ref 6.0–8.3)

## 2021-05-04 NOTE — Assessment & Plan Note (Signed)
I have congratulated her in normalization of   BMI  Using the [place]  and regular exercise a minimum of 5 days per week.

## 2021-05-04 NOTE — Assessment & Plan Note (Signed)
I recommend trying melatonin for  Insomnia taken on  a regular basis to help your internal clock.  Take every evening after dinner start with 5 mg dose   Max effective dose is 10 mg

## 2021-05-06 LAB — SARS-COV-2 SEMI-QUANTITATIVE TOTAL ANTIBODY, SPIKE: SARS COV2 AB, Total Spike Semi QN: >2500 U/mL — ABNORMAL HIGH (ref <0.8)

## 2021-05-11 ENCOUNTER — Ambulatory Visit (INDEPENDENT_AMBULATORY_CARE_PROVIDER_SITE_OTHER): Payer: BC Managed Care – PPO | Admitting: Internal Medicine

## 2021-05-11 ENCOUNTER — Other Ambulatory Visit: Payer: Self-pay

## 2021-05-11 ENCOUNTER — Encounter: Payer: Self-pay | Admitting: Internal Medicine

## 2021-05-11 VITALS — BP 100/60 | HR 98 | Temp 96.1°F | Resp 14 | Ht 63.0 in | Wt 140.6 lb

## 2021-05-11 DIAGNOSIS — K6289 Other specified diseases of anus and rectum: Secondary | ICD-10-CM | POA: Insufficient documentation

## 2021-05-11 NOTE — Progress Notes (Signed)
Subjective:  Patient ID: Hayley Lynn, female    DOB: November 19, 1960  Age: 60 y.o. MRN: [id number]  CC: The encounter diagnosis was Rectal mass.  HPI [name] presents for evaluation of recent finding of a painless lump near her anus.     This visit occurred during the SARS-CoV-2 public health emergency.  Safety protocols were in place, including screening questions prior to the visit, additional usage of staff PPE, and extensive cleaning of exam room while observing appropriate contact time as indicated for disinfecting solutions.   60 yr old female with History of  internal hemorrhoids,  diverticulosis presents with rectal mass palpable to patient during self exam/  . She has a history of bowel irregularity, but for hte last several weeks has had a sensation of  incomplete emptying.  Occasional  BRBPR, always  painless .  Did a self exam and felt a mobile polyp  Outpatient Medications Prior to Visit  Medication Sig Dispense Refill   ALPRAZolam (XANAX) 0.5 MG tablet Take 1 tablet (0.5 mg total) by mouth at bedtime as needed for anxiety. 30 tablet 0   Calcium Carb-Cholecalciferol (CALCIUM 1000 + D PO) Take 1 capsule by mouth daily.     clobetasol (TEMOVATE) 0.05 % external solution Apply topically 2 (two) times daily.     estradiol (ESTRACE) 1 MG tablet TAKE ONE AND ONE-HALF TABLETS BY MOUTH ONE TIME DAILY 90 tablet 0   eszopiclone (LUNESTA) 2 MG TABS tablet Take 1 tablet (2 mg total) by mouth at bedtime. Take immediately before bedtime 30 tablet 5   FLUoxetine (PROZAC) 40 MG capsule Take 1 capsule (40 mg total) by mouth daily. 90 capsule 1   Lactobacillus (PROBIOTIC ACIDOPHILUS PO) Take 1 capsule by mouth daily.     valACYclovir (VALTREX) 1000 MG tablet Take 1 tablet (1,000 mg total) by mouth 3 (three) times daily. 21 tablet 3   benzonatate (TESSALON) 200 MG capsule Take 1 capsule (200 mg total) by mouth 2 (two) times daily as needed for cough. (Patient not taking: No sig reported) 60  capsule 0   No facility-administered medications prior to visit.    Review of Systems;  Patient denies headache, fevers, malaise, unintentional weight loss, skin rash, eye pain, sinus congestion and sinus pain, sore throat, dysphagia,  hemoptysis , cough, dyspnea, wheezing, chest pain, palpitations, orthopnea, edema, abdominal pain, nausea, melena, diarrhea, constipation, flank pain, dysuria, hematuria, urinary  Frequency, nocturia, numbness, tingling, seizures,  Focal weakness, Loss of consciousness,  Tremor, insomnia, depression, anxiety, and suicidal ideation.      Objective:  BP 100/60 (BP Location: Left Arm, Patient Position: Sitting, Cuff Size: Normal)   Pulse 98   Temp (!) 96.1 F (35.6 C) (Temporal)   Resp 14   Ht 5\' 3"  (1.6 m)   Wt 140 lb 9.6 oz (63.8 kg)   SpO2 98%   BMI 24.91 kg/m   BP Readings from Last 3 Encounters:  05/11/21 100/60  05/03/21 100/70  03/15/21 110/65    Wt Readings from Last 3 Encounters:  05/11/21 140 lb 9.6 oz (63.8 kg)  05/03/21 139 lb 9.6 oz (63.3 kg)  03/15/21 138 lb (62.6 kg)    General appearance: alert, cooperative and appears stated age  Lungs: clear to auscultation bilaterally Heart: regular rate and rhythm, S1, S2 normal, no murmur, click, rub or gallop Abdomen: soft, non-tender; bowel sounds normal; no masses,  no organomegaly Rectal:  peppercorn sized mobile hard non tender rectal mass at 6:00 position  approx 0.5 cm from anus  Pulses: 2+ and symmetric Skin: Skin color, texture, turgor normal. No rashes or lesions Lymph nodes: Cervical, supraclavicular, and axillary nodes normal.  Lab Results  Component Value Date   HGBA1C 5.1 02/06/2018   HGBA1C 5.3 09/19/2013    Lab Results  Component Value Date   CREATININE 0.70 05/03/2021   CREATININE 0.56 03/15/2021   CREATININE 0.75 07/16/2020    Lab Results  Component Value Date   WBC 7.8 03/15/2021   HGB 12.3 03/15/2021   HCT 36.1 03/15/2021   PLT 305 03/15/2021   GLUCOSE  81 05/03/2021   CHOL 175 07/16/2020   TRIG 110.0 07/16/2020   HDL 51.30 07/16/2020   LDLDIRECT 117 03/25/2016   LDLCALC 102 (H) 07/16/2020   ALT 15 05/03/2021   AST 22 05/03/2021   NA 137 05/03/2021   K 4.0 05/03/2021   CL 101 05/03/2021   CREATININE 0.70 05/03/2021   BUN 19 05/03/2021   CO2 30 05/03/2021   TSH 2.21 12/02/2019   INR 0.9 02/22/2012   HGBA1C 5.1 02/06/2018    DG Chest 2 View  Result Date: 03/15/2021 CLINICAL DATA:  Right lower leg pain. EXAM: CHEST - 2 VIEW COMPARISON:  July 28, 2014 FINDINGS: The lungs are hyperinflated. Mild, chronic appearing increased lung markings are seen. An ill-defined nipple shadow is suspected overlying the left lung base. There is no evidence of acute infiltrate, pleural effusion or pneumothorax. The heart size and mediastinal contours are within normal limits. The visualized skeletal structures are unremarkable. IMPRESSION: Stable exam without active cardiopulmonary disease. Electronically Signed   By: Virgina Norfolk M.D.   On: 03/15/2021 20:43   US Venous Img Lower Unilateral Right  Result Date: 03/15/2021 CLINICAL DATA:  Calf pain EXAM: RIGHT LOWER EXTREMITY VENOUS DOPPLER ULTRASOUND TECHNIQUE: Gray-scale sonography with compression, as well as color and duplex ultrasound, were performed to evaluate the deep venous system(s) from the level of the common femoral vein through the popliteal and proximal calf veins. COMPARISON:  None. FINDINGS: VENOUS Normal compressibility of the common femoral, superficial femoral, and popliteal veins, as well as the visualized calf veins. Visualized portions of profunda femoral vein and great saphenous vein unremarkable. No filling defects to suggest DVT on grayscale or color Doppler imaging. Doppler waveforms show normal direction of venous flow, normal respiratory plasticity and response to augmentation. Limited views of the contralateral common femoral vein are unremarkable. OTHER None. Limitations: none  IMPRESSION: Negative. Electronically Signed   By: Constance Holster M.D.   On: 03/15/2021 23:11    Assessment & Plan:   Problem List Items Addressed This Visit       Unprioritized   Rectal mass - Primary    Without visualization I cannot tell if this is an internal hemorrhoid, but given its consistency it would be thrombosed, and it is nontender.  She needs aunuscopy. Referring to Dr Bary Castilla        Relevant Orders   Ambulatory referral to General Surgery    I am having Suzana B. Idris maintain her valACYclovir, clobetasol, Calcium Carb-Cholecalciferol (CALCIUM 1000 + D PO), Lactobacillus (PROBIOTIC ACIDOPHILUS PO), eszopiclone, FLUoxetine, benzonatate, ALPRAZolam, and estradiol.  No orders of the defined types were placed in this encounter.   There are no discontinued medications.  Follow-up: No follow-ups on file.   Crecencio Mc, MD

## 2021-05-11 NOTE — Patient Instructions (Signed)
I am Referring you to Dr Bary Castilla at [facility]

## 2021-05-11 NOTE — Assessment & Plan Note (Signed)
Without visualization I cannot tell if this is an internal hemorrhoid, but given its consistency it would be thrombosed, and it is nontender.  She needs aunuscopy. Referring to Dr Bary Castilla

## 2021-05-12 ENCOUNTER — Other Ambulatory Visit: Payer: Self-pay | Admitting: Internal Medicine

## 2021-05-13 NOTE — Telephone Encounter (Signed)
RX Refill:xanax Last Seen:05-11-21 Last ordered:03-15-21

## 2021-05-20 DIAGNOSIS — K642 Third degree hemorrhoids: Secondary | ICD-10-CM | POA: Diagnosis not present

## 2021-06-10 DIAGNOSIS — K642 Third degree hemorrhoids: Secondary | ICD-10-CM | POA: Diagnosis not present

## 2021-06-14 ENCOUNTER — Other Ambulatory Visit: Payer: Self-pay | Admitting: Internal Medicine

## 2021-06-14 NOTE — Telephone Encounter (Signed)
RX Refill:lunesta Last Seen:05-11-21 Last ordered:12-17-20

## 2021-06-25 ENCOUNTER — Encounter: Payer: Self-pay | Admitting: Internal Medicine

## 2021-07-03 ENCOUNTER — Other Ambulatory Visit: Payer: Self-pay | Admitting: Internal Medicine

## 2021-07-04 ENCOUNTER — Emergency Department: Payer: BC Managed Care – PPO

## 2021-07-04 ENCOUNTER — Emergency Department
Admission: EM | Admit: 2021-07-04 | Discharge: 2021-07-04 | Disposition: A | Discharge status: Home / Self Care | Payer: BC Managed Care – PPO | Attending: Emergency Medicine | Admitting: Emergency Medicine

## 2021-07-04 ENCOUNTER — Other Ambulatory Visit: Payer: Self-pay

## 2021-07-04 ENCOUNTER — Encounter: Payer: Self-pay | Admitting: Emergency Medicine

## 2021-07-04 DIAGNOSIS — Z5321 Procedure and treatment not carried out due to patient leaving prior to being seen by health care provider: Secondary | ICD-10-CM | POA: Insufficient documentation

## 2021-07-04 DIAGNOSIS — R0602 Shortness of breath: Secondary | ICD-10-CM | POA: Diagnosis not present

## 2021-07-04 DIAGNOSIS — R110 Nausea: Secondary | ICD-10-CM | POA: Diagnosis not present

## 2021-07-04 DIAGNOSIS — R42 Dizziness and giddiness: Secondary | ICD-10-CM | POA: Diagnosis not present

## 2021-07-04 DIAGNOSIS — R072 Precordial pain: Secondary | ICD-10-CM | POA: Insufficient documentation

## 2021-07-04 DIAGNOSIS — R0789 Other chest pain: Secondary | ICD-10-CM | POA: Diagnosis not present

## 2021-07-04 DIAGNOSIS — R079 Chest pain, unspecified: Secondary | ICD-10-CM | POA: Diagnosis not present

## 2021-07-04 LAB — TROPONIN I (HIGH SENSITIVITY): Troponin I (High Sensitivity): <2 ng/L (ref <18)

## 2021-07-04 LAB — BASIC METABOLIC PANEL
Anion gap: 6 (ref 5–15)
BUN: 15 mg/dL (ref 8–23)
CO2: 27 mmol/L (ref 22–32)
Calcium: 8.6 mg/dL — ABNORMAL LOW (ref 8.9–10.3)
Chloride: 103 mmol/L (ref 98–111)
Creatinine, Ser: 0.63 mg/dL (ref 0.44–1.00)
GFR, Estimated: >60 mL/min (ref >60)
Glucose, Bld: 95 mg/dL (ref 70–99)
Potassium: 4 mmol/L (ref 3.5–5.1)
Sodium: 136 mmol/L (ref 135–145)

## 2021-07-04 LAB — CBC
HCT: 34 % — ABNORMAL LOW (ref 36.0–46.0)
Hemoglobin: 11.9 g/dL — ABNORMAL LOW (ref 12.0–15.0)
MCH: 34 pg (ref 26.0–34.0)
MCHC: 35 g/dL (ref 30.0–36.0)
MCV: 97.1 fL (ref 80.0–100.0)
Platelets: 247 10*3/uL (ref 150–400)
RBC: 3.5 MIL/uL — ABNORMAL LOW (ref 3.87–5.11)
RDW: 12.3 % (ref 11.5–15.5)
WBC: 5.9 10*3/uL (ref 4.0–10.5)
nRBC: 0 % (ref 0.0–0.2)

## 2021-07-04 NOTE — ED Triage Notes (Signed)
Pt via EMS from home. Pt c/o mid sternal CP that radiates to her back around 2:00pm today. Pt states the pain last about several mins. Now pt states now its just a constant pressure. Denies cardiac hx. Denies NV. Pt states she feels slightly SOB. EMS 324 ASA. Pt is A&OX4 and NAD.

## 2021-07-04 NOTE — ED Triage Notes (Signed)
Patient arrived by EMS from home for cp. EMS found patient in floor doubled over. Described it as pressure. EMS administered 324 aspirin. EMS vitals 124/75 b/p, HR 72. Bloosd sugar 112

## 2021-07-08 ENCOUNTER — Other Ambulatory Visit: Payer: Self-pay

## 2021-07-08 ENCOUNTER — Ambulatory Visit
Admission: RE | Admit: 2021-07-08 | Discharge: 2021-07-08 | Disposition: A | Discharge status: Home / Self Care | Payer: BC Managed Care – PPO | Source: Ambulatory Visit | Attending: Internal Medicine | Admitting: Internal Medicine

## 2021-07-08 DIAGNOSIS — Z1231 Encounter for screening mammogram for malignant neoplasm of breast: Secondary | ICD-10-CM | POA: Diagnosis not present

## 2021-09-08 ENCOUNTER — Other Ambulatory Visit: Payer: Self-pay | Admitting: Internal Medicine

## 2021-09-21 DIAGNOSIS — M170 Bilateral primary osteoarthritis of knee: Secondary | ICD-10-CM | POA: Diagnosis not present

## 2021-10-01 DIAGNOSIS — M170 Bilateral primary osteoarthritis of knee: Secondary | ICD-10-CM | POA: Diagnosis not present

## 2021-10-05 DIAGNOSIS — M170 Bilateral primary osteoarthritis of knee: Secondary | ICD-10-CM | POA: Diagnosis not present

## 2021-11-09 ENCOUNTER — Other Ambulatory Visit: Payer: Self-pay | Admitting: Internal Medicine

## 2021-11-15 ENCOUNTER — Other Ambulatory Visit: Payer: Self-pay | Admitting: Internal Medicine

## 2021-11-16 NOTE — Telephone Encounter (Signed)
Alprazolam  Refilled: 05/13/2021 Lunesta   Refilled: 06/14/2021  Last OV: 05/03/2021 Next OV: not scheduled

## 2021-12-13 ENCOUNTER — Other Ambulatory Visit: Payer: Self-pay | Admitting: Internal Medicine

## 2021-12-20 ENCOUNTER — Other Ambulatory Visit: Payer: Self-pay | Admitting: Internal Medicine

## 2022-03-09 ENCOUNTER — Other Ambulatory Visit: Payer: Self-pay | Admitting: Internal Medicine

## 2022-04-21 ENCOUNTER — Other Ambulatory Visit: Payer: Self-pay | Admitting: Internal Medicine

## 2022-05-25 ENCOUNTER — Other Ambulatory Visit: Payer: Self-pay | Admitting: Internal Medicine

## 2022-05-25 NOTE — Telephone Encounter (Addendum)
Alprazolam Refilled: 11/17/2021 Lunesta Refilled: 11/17/2021  Last OV: 05/03/2021 Next OV: 06/21/2022

## 2022-05-26 MED ORDER — FLUOXETINE HCL 40 MG PO CAPS: 40.0000 mg | ORAL_CAPSULE | Freq: Every day | ORAL | 0 refills | Status: DC

## 2022-05-26 NOTE — Addendum Note (Signed)
Addended by: Crecencio Mc on: 05/26/2022 12:58 PM   Modules accepted: Orders

## 2022-06-21 ENCOUNTER — Other Ambulatory Visit (HOSPITAL_COMMUNITY)
Admission: RE | Admit: 2022-06-21 | Discharge: 2022-06-21 | Disposition: A | Discharge status: Home / Self Care | Payer: BC Managed Care – PPO | Source: Ambulatory Visit | Attending: Internal Medicine | Admitting: Internal Medicine

## 2022-06-21 ENCOUNTER — Ambulatory Visit (INDEPENDENT_AMBULATORY_CARE_PROVIDER_SITE_OTHER): Payer: BC Managed Care – PPO | Admitting: Internal Medicine

## 2022-06-21 ENCOUNTER — Other Ambulatory Visit: Payer: Self-pay

## 2022-06-21 ENCOUNTER — Encounter: Payer: Self-pay | Admitting: Internal Medicine

## 2022-06-21 ENCOUNTER — Telehealth: Payer: Self-pay

## 2022-06-21 VITALS — BP 116/72 | HR 75 | Temp 97.9°F | Ht 63.0 in | Wt 148.8 lb

## 2022-06-21 DIAGNOSIS — Z124 Encounter for screening for malignant neoplasm of cervix: Secondary | ICD-10-CM | POA: Diagnosis not present

## 2022-06-21 DIAGNOSIS — Z90711 Acquired absence of uterus with remaining cervical stump: Secondary | ICD-10-CM

## 2022-06-21 DIAGNOSIS — E559 Vitamin D deficiency, unspecified: Secondary | ICD-10-CM | POA: Diagnosis not present

## 2022-06-21 DIAGNOSIS — Z0000 Encounter for general adult medical examination without abnormal findings: Secondary | ICD-10-CM

## 2022-06-21 DIAGNOSIS — K648 Other hemorrhoids: Secondary | ICD-10-CM | POA: Diagnosis not present

## 2022-06-21 DIAGNOSIS — Z1211 Encounter for screening for malignant neoplasm of colon: Secondary | ICD-10-CM

## 2022-06-21 DIAGNOSIS — R5383 Other fatigue: Secondary | ICD-10-CM | POA: Diagnosis not present

## 2022-06-21 DIAGNOSIS — K5909 Other constipation: Secondary | ICD-10-CM

## 2022-06-21 DIAGNOSIS — Z86010 Personal history of colonic polyps: Secondary | ICD-10-CM

## 2022-06-21 DIAGNOSIS — Z1231 Encounter for screening mammogram for malignant neoplasm of breast: Secondary | ICD-10-CM

## 2022-06-21 DIAGNOSIS — R7301 Impaired fasting glucose: Secondary | ICD-10-CM

## 2022-06-21 MED ORDER — NA SULFATE-K SULFATE-MG SULF 17.5-3.13-1.6 GM/177ML PO SOLN: 1.0000 | Freq: Once | ORAL | 0 refills | Status: AC

## 2022-06-21 NOTE — Telephone Encounter (Signed)
Gastroenterology Pre-Procedure Review  Request Date: 08/09/22 Requesting Physician: Dr. Allen Norris  PATIENT REVIEW QUESTIONS: The patient responded to the following health history questions as indicated:    1. Are you having any GI issues? no 2. Do you have a personal history of Polyps? yes (12/30/16 colonoscopy performed by Dr. Allen Norris) 3. Do you have a family history of Colon Cancer or Polyps? no 4. Diabetes Mellitus? no 5. Joint replacements in the past 12 months?no 6. Major health problems in the past 3 months?no 7. Any artificial heart valves, MVP, or defibrillator?no    MEDICATIONS & ALLERGIES:    Patient reports the following regarding taking any anticoagulation/antiplatelet therapy:   Plavix, Coumadin, Eliquis, Xarelto, Lovenox, Pradaxa, Brilinta, or Effient? no Aspirin? no  Patient confirms/reports the following medications:  Current Outpatient Medications  Medication Sig Dispense Refill   ALPRAZolam (XANAX) 0.5 MG tablet TAKE ONE TABLET BY MOUTH AT BEDTIME AS NEEDED FOR ANXIETY 30 tablet 2   benzonatate (TESSALON) 200 MG capsule Take 1 capsule (200 mg total) by mouth 2 (two) times daily as needed for cough. (Patient not taking: Reported on 05/03/2021) 60 capsule 0   Calcium Carb-Cholecalciferol (CALCIUM 1000 + D PO) Take 1 capsule by mouth daily.     clobetasol (TEMOVATE) 0.05 % external solution Apply topically 2 (two) times daily.     estradiol (ESTRACE) 1 MG tablet TAKE 1 & 1/2 TABLETS BY MOUTH ONCE DAILY 90 tablet 2   eszopiclone (LUNESTA) 2 MG TABS tablet TAKE ONE TABLET BY MOUTH AT BEDTIME IMMEDIATELY BEFORE BEDTIME 30 tablet 2   FLUoxetine (PROZAC) 40 MG capsule Take 1 capsule (40 mg total) by mouth daily. 90 capsule 0   Lactobacillus (PROBIOTIC ACIDOPHILUS PO) Take 1 capsule by mouth daily.     valACYclovir (VALTREX) 1000 MG tablet Take 1 tablet (1,000 mg total) by mouth 3 (three) times daily. 21 tablet 3   No current facility-administered medications for this visit.     Patient confirms/reports the following allergies:  Allergies  Allergen Reactions   Capparis Spinosa Nausea And Vomiting    ([place])   Sulfa Antibiotics Rash    No orders of the defined types were placed in this encounter.   AUTHORIZATION INFORMATION Primary Insurance: 1D#: Group #:  Secondary Insurance: 1D#: Group #:  SCHEDULE INFORMATION: Date: 08/09/22 Time: Location: [facility]

## 2022-06-21 NOTE — Progress Notes (Signed)
The patient is here for annual preventive examination and management of other chronic and acute problems.   The risk factors are reflected in the social history.   The roster of all physicians providing medical care to patient - is listed in the Snapshot section of the chart.   Activities of daily living:  The patient is 100% independent in all ADLs: dressing, toileting, feeding as well as independent mobility   Home safety : The patient has smoke detectors in the home. They wear seatbelts.  There are no unsecured firearms at home. There is no violence in the home.    There is no risks for hepatitis, STDs or HIV. There is no   history of blood transfusion. They have no travel history to infectious disease endemic areas of the world.   The patient has seen their dentist in the last six month. They have seen their eye doctor in the last year. The patinet  denies slight hearing difficulty with regard to whispered voices and some television programs.  They have deferred audiologic testing in the last year.  They do not  have excessive sun exposure. Discussed the need for sun protection: hats, long sleeves and use of sunscreen if there is significant sun exposure.    Diet: the importance of a healthy diet is discussed. They do have a healthy diet.   The benefits of regular aerobic exercise were discussed. The patient  exercises  3 to 5 days per week  for  60 minutes.    Depression screen: there are no signs or vegative symptoms of depression- irritability, change in appetite, anhedonia, sadness/tearfullness.   The following portions of the patient's history were reviewed and updated as appropriate: allergies, current medications, past family history, past medical history,  past surgical history, past social history  and problem list.   Visual acuity was not assessed per patient preference since the patient has regular follow up with an  ophthalmologist. Hearing and body mass index were assessed and  reviewed.    During the course of the visit the patient was educated and counseled about appropriate screening and preventive services including : fall prevention , diabetes screening, nutrition counseling, colorectal cancer screening, and recommended immunizations.    Chief Complaint:   none   Review of Symptoms  Patient denies headache, fevers, malaise, unintentional weight loss, skin rash, eye pain, sinus congestion and sinus pain, sore throat, dysphagia,  hemoptysis , cough, dyspnea, wheezing, chest pain, palpitations, orthopnea, edema, abdominal pain, nausea, melena, diarrhea, constipation, flank pain, dysuria, hematuria, urinary  Frequency, nocturia, numbness, tingling, seizures,  Focal weakness, Loss of consciousness,  Tremor, insomnia, depression, anxiety, and suicidal ideation.    Physical Exam:  BP 116/72 (BP Location: Left Arm, Patient Position: Sitting, Cuff Size: Normal)   Pulse 75   Temp 97.9 F (36.6 C) (Oral)   Ht '5\' 3"'$  (1.6 m)   Wt 148 lb 12.8 oz (67.5 kg)   SpO2 97%   BMI 26.36 kg/m    General Appearance:    Alert, cooperative, no distress, appears stated age  Head:    Normocephalic, without obvious abnormality, atraumatic  Eyes:    PERRL, conjunctiva/corneas clear, EOM's intact, fundi    benign, both eyes  Ears:    Normal TM's and external ear canals, both ears  Nose:   Nares normal, septum midline, mucosa normal, no drainage    or sinus tenderness  Throat:   Lips, mucosa, and tongue normal; teeth and gums normal  Neck:  Supple, symmetrical, trachea midline, no adenopathy;    thyroid:  no enlargement/tenderness/nodules; no carotid   bruit or JVD  Back:     Symmetric, no curvature, ROM normal, no CVA tenderness  Lungs:     Clear to auscultation bilaterally, respirations unlabored  Chest Wall:    No tenderness or deformity   Heart:    Regular rate and rhythm, S1 and S2 normal, no murmur, rub   or gallop  Breast Exam:    No tenderness, masses, or nipple  abnormality  Abdomen:     Soft, non-tender, bowel sounds active all four quadrants,    no masses, no organomegaly  Genitalia:    Pelvic: cervix normal ,  external genitalia normal, no adnexal masses or tenderness,  rectovaginal septum normal, uterus surgically  absent and vagina normal without discharge  Extremities:   Extremities normal, atraumatic, no cyanosis or edema  Pulses:   2+ and symmetric all extremities  Skin:   Skin color, texture, turgor normal, no rashes or lesions  Lymph nodes:   Cervical, supraclavicular, and axillary nodes normal  Neurologic:   CNII-XII intact, normal strength, sensation and reflexes    throughout     Assessment and Plan:  Routine general medical examination at a health care facility age appropriate education and counseling updated, referrals for preventative services and immunizations addressed, dietary and smoking counseling addressed, most recent labs reviewed.  I have personally reviewed and have noted:   1) the patient's medical and social history 2) The pt's use of alcohol, tobacco, and illicit drugs 3) The patient's current medications and supplements 4) Functional ability including ADL's, fall risk, home safety risk, hearing and visual impairment 5) Diet and physical activities 6) Evidence for depression or mood disorder 7) The patient's height, weight, and BMI have been recorded in the chart  I have made referrals, and provided counseling and education based on review of the above  Internal prolapsed hemorrhoids S/p banding BY Dr Bary Castilla 2023/    Updated Medication List Outpatient Encounter Medications as of 06/21/2022  Medication Sig   ALPRAZolam (XANAX) 0.5 MG tablet TAKE ONE TABLET BY MOUTH AT BEDTIME AS NEEDED FOR ANXIETY   Calcium Carb-Cholecalciferol (CALCIUM 1000 + D PO) Take 1 capsule by mouth daily.   clobetasol (TEMOVATE) 0.05 % external solution Apply topically 2 (two) times daily.   estradiol (ESTRACE) 1 MG tablet TAKE 1 & 1/2  TABLETS BY MOUTH ONCE DAILY   eszopiclone (LUNESTA) 2 MG TABS tablet TAKE ONE TABLET BY MOUTH AT BEDTIME IMMEDIATELY BEFORE BEDTIME   FLUoxetine (PROZAC) 40 MG capsule Take 1 capsule (40 mg total) by mouth daily.   Lactobacillus (PROBIOTIC ACIDOPHILUS PO) Take 1 capsule by mouth daily.   valACYclovir (VALTREX) 1000 MG tablet Take 1 tablet (1,000 mg total) by mouth 3 (three) times daily.   benzonatate (TESSALON) 200 MG capsule Take 1 capsule (200 mg total) by mouth 2 (two) times daily as needed for cough. (Patient not taking: Reported on 05/03/2021)   No facility-administered encounter medications on file as of 06/21/2022.

## 2022-06-21 NOTE — Patient Instructions (Addendum)
Your annual mammogram has been ordered AND IS DUE in LATE AUGUST.    [facility] will not allow [place] to schedule it for you,  so please  call to make your appointment [place] :  [employer] "low carb power bowl"  entrees and  "Steamer" entrees are are great low carb entrees that microwave in 5 minutes      REFERRAL TO [facility]  FOR COLONOSCOPY (DUE NOW) IN PROGRESS

## 2022-06-21 NOTE — Assessment & Plan Note (Signed)

## 2022-06-21 NOTE — Assessment & Plan Note (Signed)
S/p banding BY Dr Bary Castilla 2023/

## 2022-06-22 LAB — COMPREHENSIVE METABOLIC PANEL
ALT: 15 U/L (ref 0–35)
AST: 21 U/L (ref 0–37)
Albumin: 3.9 g/dL (ref 3.5–5.2)
Alkaline Phosphatase: 54 U/L (ref 39–117)
BUN: 16 mg/dL (ref 6–23)
CO2: 27 mEq/L (ref 19–32)
Calcium: 8.7 mg/dL (ref 8.4–10.5)
Chloride: 101 mEq/L (ref 96–112)
Creatinine, Ser: 0.63 mg/dL (ref 0.40–1.20)
GFR: 95.28 mL/min (ref >60.00)
Glucose, Bld: 81 mg/dL (ref 70–99)
Potassium: 4.2 mEq/L (ref 3.5–5.1)
Sodium: 137 mEq/L (ref 135–145)
Total Bilirubin: 0.4 mg/dL (ref 0.2–1.2)
Total Protein: 6.6 g/dL (ref 6.0–8.3)

## 2022-06-22 LAB — LIPID PANEL
Cholesterol: 210 mg/dL — ABNORMAL HIGH (ref 0–200)
HDL: 77.8 mg/dL (ref >39.00)
LDL Cholesterol: 115 mg/dL — ABNORMAL HIGH (ref 0–99)
NonHDL: 132.16
Total CHOL/HDL Ratio: 3
Triglycerides: 87 mg/dL (ref 0.0–149.0)
VLDL: 17.4 mg/dL (ref 0.0–40.0)

## 2022-06-22 LAB — HEMOGLOBIN A1C: Hgb A1c MFr Bld: 5.1 % (ref 4.6–6.5)

## 2022-06-22 LAB — TSH: TSH: 2.14 u[IU]/mL (ref 0.35–5.50)

## 2022-06-22 LAB — VITAMIN D 25 HYDROXY (VIT D DEFICIENCY, FRACTURES): VITD: 36.92 ng/mL (ref 30.00–100.00)

## 2022-06-24 LAB — CYTOLOGY - PAP
Adequacy: ABSENT
Comment: NEGATIVE
Diagnosis: NEGATIVE
High risk HPV: NEGATIVE

## 2022-06-27 ENCOUNTER — Telehealth: Payer: Self-pay

## 2022-06-27 NOTE — Telephone Encounter (Signed)
Pt called back and I read message to her and she understood

## 2022-06-27 NOTE — Telephone Encounter (Signed)
noted 

## 2022-06-27 NOTE — Telephone Encounter (Signed)
-----   Message from Kennyth Arnold, [place] sent at 06/25/2022 10:57 AM EDT ----- Pap smear normal. If you feel itchy, let us know. There was some yeast present but may not be enough to require treatment.

## 2022-06-27 NOTE — Telephone Encounter (Signed)
LMTCB in regards to lab results.  

## 2022-07-04 ENCOUNTER — Telehealth: Payer: Self-pay

## 2022-07-04 NOTE — Telephone Encounter (Signed)
Returned patients call to reschedule her 08/09/22 Colonoscopy with Dr. Allen Norris.  LVM for her to call back.  I will also send her a mychart message to see when she would like to reschedule.  Thanks,  [place], [place]

## 2022-08-02 DIAGNOSIS — L570 Actinic keratosis: Secondary | ICD-10-CM | POA: Diagnosis not present

## 2022-08-02 DIAGNOSIS — L298 Other pruritus: Secondary | ICD-10-CM | POA: Diagnosis not present

## 2022-08-02 DIAGNOSIS — L821 Other seborrheic keratosis: Secondary | ICD-10-CM | POA: Diagnosis not present

## 2022-08-09 ENCOUNTER — Ambulatory Visit: Admit: 2022-08-09 | Payer: BC Managed Care – PPO | Admitting: Gastroenterology

## 2022-08-09 SURGERY — COLONOSCOPY WITH PROPOFOL
Anesthesia: General

## 2022-08-22 ENCOUNTER — Other Ambulatory Visit: Payer: Self-pay | Admitting: Internal Medicine

## 2022-08-22 MED ORDER — ESZOPICLONE 2 MG PO TABS
2.0000 mg | ORAL_TABLET | Freq: Every evening | ORAL | 2 refills | Status: DC | PRN
Start: 2022-08-22 — End: 2022-08-26

## 2022-08-22 NOTE — Telephone Encounter (Signed)
Refilled Lunesta in error, will still need to be resent by PCP

## 2022-08-26 ENCOUNTER — Encounter: Payer: Self-pay | Admitting: Internal Medicine

## 2022-08-26 ENCOUNTER — Other Ambulatory Visit: Payer: Self-pay | Admitting: Internal Medicine

## 2022-08-26 MED ORDER — ESZOPICLONE 2 MG PO TABS
2.0000 mg | ORAL_TABLET | Freq: Every evening | ORAL | 3 refills | Status: DC | PRN
Start: 2022-08-26 — End: 2022-08-26

## 2022-08-26 MED ORDER — ESZOPICLONE 2 MG PO TABS
2.0000 mg | ORAL_TABLET | Freq: Every evening | ORAL | 3 refills | Status: DC | PRN
Start: 2022-08-26 — End: 2022-12-26

## 2022-08-26 NOTE — Telephone Encounter (Signed)
Rx did not go electronically on 08/22/2022. Can you please refill.   Last OV: 06/21/2022 Next OV: 06/23/2023

## 2022-09-17 ENCOUNTER — Other Ambulatory Visit: Payer: Self-pay | Admitting: Internal Medicine

## 2022-10-26 NOTE — Telephone Encounter (Signed)
MyChart messgae sent to patient. 

## 2022-11-11 ENCOUNTER — Other Ambulatory Visit: Payer: Self-pay

## 2022-11-11 ENCOUNTER — Encounter: Payer: Self-pay | Admitting: Internal Medicine

## 2022-11-11 MED ORDER — FLUOXETINE HCL 40 MG PO CAPS: 40.0000 mg | ORAL_CAPSULE | Freq: Every day | ORAL | 1 refills | Status: DC

## 2022-11-18 ENCOUNTER — Other Ambulatory Visit: Payer: Self-pay | Admitting: Internal Medicine

## 2022-12-25 ENCOUNTER — Other Ambulatory Visit: Payer: Self-pay | Admitting: Internal Medicine

## 2022-12-26 NOTE — Telephone Encounter (Signed)
Refilled: 08/26/2022 Last OV: 06/21/2022 Next OV: 06/23/2023

## 2023-01-04 ENCOUNTER — Other Ambulatory Visit: Payer: Self-pay | Admitting: Internal Medicine

## 2023-01-04 DIAGNOSIS — Z1231 Encounter for screening mammogram for malignant neoplasm of breast: Secondary | ICD-10-CM

## 2023-01-24 ENCOUNTER — Ambulatory Visit
Admission: RE | Admit: 2023-01-24 | Discharge: 2023-01-24 | Disposition: A | Discharge status: Home / Self Care | Payer: BC Managed Care – PPO | Source: Ambulatory Visit | Attending: Internal Medicine | Admitting: Internal Medicine

## 2023-01-24 DIAGNOSIS — Z1231 Encounter for screening mammogram for malignant neoplasm of breast: Secondary | ICD-10-CM | POA: Diagnosis not present

## 2023-03-08 ENCOUNTER — Other Ambulatory Visit: Payer: Self-pay | Admitting: Internal Medicine

## 2023-03-13 NOTE — Telephone Encounter (Signed)
Sent a mychart msg informing pt to maintain August appointment and that refills were sent as a courtesy as I did not see Dr. Sharene Butters msg

## 2023-04-08 ENCOUNTER — Other Ambulatory Visit: Payer: Self-pay | Admitting: Internal Medicine

## 2023-04-11 NOTE — Telephone Encounter (Signed)
LOV: 06/21/22  NOV: 06/27/23

## 2023-05-08 ENCOUNTER — Encounter: Payer: Self-pay | Admitting: Internal Medicine

## 2023-05-08 NOTE — Telephone Encounter (Signed)
Requesting: Alprazolam Contract: No UDS: NO Last Visit: 06/21/2022 Next Visit: 06/27/2023 Last Refill: 03/09/2023  Please Advise

## 2023-05-12 ENCOUNTER — Other Ambulatory Visit: Payer: Self-pay | Admitting: Internal Medicine

## 2023-05-12 DIAGNOSIS — C439 Malignant melanoma of skin, unspecified: Secondary | ICD-10-CM | POA: Insufficient documentation

## 2023-05-12 MED ORDER — ALPRAZOLAM 0.5 MG PO TABS: 0.5000 mg | ORAL_TABLET | Freq: Every evening | ORAL | 3 refills | Status: DC | PRN

## 2023-05-12 NOTE — Assessment & Plan Note (Signed)
On scalp,  removed by [facility] Moh;s procedure June 2023.  Follow up every 6 months  with Willeen Niece

## 2023-05-28 ENCOUNTER — Other Ambulatory Visit: Payer: Self-pay | Admitting: Internal Medicine

## 2023-05-31 DIAGNOSIS — U071 COVID-19: Secondary | ICD-10-CM | POA: Diagnosis not present

## 2023-06-01 ENCOUNTER — Telehealth: Payer: BC Managed Care – PPO | Admitting: Internal Medicine

## 2023-06-14 ENCOUNTER — Encounter (INDEPENDENT_AMBULATORY_CARE_PROVIDER_SITE_OTHER): Payer: Self-pay

## 2023-06-23 ENCOUNTER — Encounter: Payer: BC Managed Care – PPO | Admitting: Internal Medicine

## 2023-06-25 ENCOUNTER — Other Ambulatory Visit: Payer: Self-pay | Admitting: Internal Medicine

## 2023-06-26 NOTE — Telephone Encounter (Signed)
Refilled: 12/26/2022  Last OV: 06/21/2022 Next OV: 06/27/2023

## 2023-06-27 ENCOUNTER — Ambulatory Visit (INDEPENDENT_AMBULATORY_CARE_PROVIDER_SITE_OTHER): Payer: BC Managed Care – PPO | Admitting: Internal Medicine

## 2023-06-27 ENCOUNTER — Encounter: Payer: Self-pay | Admitting: Internal Medicine

## 2023-06-27 VITALS — BP 102/60 | HR 81 | Temp 97.7°F | Ht 63.0 in | Wt 146.8 lb

## 2023-06-27 DIAGNOSIS — Z0000 Encounter for general adult medical examination without abnormal findings: Secondary | ICD-10-CM | POA: Diagnosis not present

## 2023-06-27 DIAGNOSIS — E785 Hyperlipidemia, unspecified: Secondary | ICD-10-CM | POA: Diagnosis not present

## 2023-06-27 DIAGNOSIS — R5383 Other fatigue: Secondary | ICD-10-CM | POA: Diagnosis not present

## 2023-06-27 DIAGNOSIS — R7301 Impaired fasting glucose: Secondary | ICD-10-CM | POA: Diagnosis not present

## 2023-06-27 DIAGNOSIS — Z1211 Encounter for screening for malignant neoplasm of colon: Secondary | ICD-10-CM

## 2023-06-27 DIAGNOSIS — F5105 Insomnia due to other mental disorder: Secondary | ICD-10-CM

## 2023-06-27 DIAGNOSIS — F419 Anxiety disorder, unspecified: Secondary | ICD-10-CM

## 2023-06-27 DIAGNOSIS — K5909 Other constipation: Secondary | ICD-10-CM

## 2023-06-27 MED ORDER — FLUOXETINE HCL 40 MG PO CAPS: 40.0000 mg | ORAL_CAPSULE | Freq: Every day | ORAL | 1 refills | Status: DC

## 2023-06-27 MED ORDER — LUBIPROSTONE 24 MCG PO CAPS: 24.0000 ug | ORAL_CAPSULE | Freq: Two times a day (BID) | ORAL | 2 refills | Status: DC

## 2023-06-27 MED ORDER — VITAMIN B-12 1000 MCG PO TABS: 1000.0000 ug | ORAL_TABLET | Freq: Every day | ORAL | Status: DC

## 2023-06-27 NOTE — Progress Notes (Unsigned)
Patient ID: Hayley Lynn, female    DOB: 1961/09/13  Age: 62 y.o. MRN: [id number]  The patient is here for annual preventive examination and management of other chronic and acute problems.   The risk factors are reflected in the social history.   The roster of all physicians providing medical care to patient - is listed in the Snapshot section of the chart.   Activities of daily living:  The patient is 100% independent in all ADLs: dressing, toileting, feeding as well as independent mobility   Home safety : The patient has smoke detectors in the home. They wear seatbelts.  There are no unsecured firearms at home. There is no violence in the home.    There is no risks for hepatitis, STDs or HIV. There is no   history of blood transfusion. They have no travel history to infectious disease endemic areas of the world.   The patient has seen their dentist in the last six month. They have seen their eye doctor in the last year. The patinet  denies slight hearing difficulty with regard to whispered voices and some television programs.  They have deferred audiologic testing in the last year.  They do not  have excessive sun exposure. Discussed the need for sun protection: hats, long sleeves and use of sunscreen if there is significant sun exposure.    Diet: the importance of a healthy diet is discussed. They do have a healthy diet.   The benefits of regular aerobic exercise were discussed. The patient  does not exercise     Depression screen: there are no signs or vegative symptoms of depression- irritability, change in appetite, anhedonia, sadness/tearfullness.   The following portions of the patient's history were reviewed and updated as appropriate: allergies, current medications, past family history, past medical history,  past surgical history, past social history  and problem list.   Visual acuity was not assessed per patient preference since the patient has regular follow up with an   ophthalmologist. Hearing and body mass index were assessed and reviewed.    During the course of the visit the patient was educated and counseled about appropriate screening and preventive services including : fall prevention , diabetes screening, nutrition counseling, colorectal cancer screening, and recommended immunizations.    Chief Complaint:  1) history of COVID infection one month ago  with intense body aches,  fever , and headache  spent $350 on Paxlovid (split  the course with husband)   2) Constipation, severe, started after COVID infection/paxlovid use  .  Has tried stool softeners, ,  fiber supplements.  Discussed trial of amitiza     Review of Symptoms  Patient denies headache, fevers, malaise, unintentional weight loss, skin rash, eye pain, sinus congestion and sinus pain, sore throat, dysphagia,  hemoptysis , cough, dyspnea, wheezing, chest pain, palpitations, orthopnea, edema, abdominal pain, nausea, melena, diarrhea, flank pain, dysuria, hematuria, urinary  Frequency, nocturia, numbness, tingling, seizures,  Focal weakness, Loss of consciousness,  Tremor, insomnia, depression, anxiety, and suicidal ideation.    Physical Exam:  BP 102/60   Pulse 81   Temp 97.7 F (36.5 C) (Oral)   Ht 5\' 3"  (1.6 m)   Wt 146 lb 12.8 oz (66.6 kg)   SpO2 99%   BMI 26.00 kg/m    Physical Exam Vitals reviewed.  Constitutional:      General: She is not in acute distress.    Appearance: Normal appearance. She is well-developed and normal weight. She is not  ill-appearing, toxic-appearing or diaphoretic.  HENT:     Head: Normocephalic.     Right Ear: Tympanic membrane, ear canal and external ear normal. There is no impacted cerumen.     Left Ear: Tympanic membrane, ear canal and external ear normal. There is no impacted cerumen.     Nose: Nose normal.     Mouth/Throat:     Mouth: Mucous membranes are moist.     Pharynx: Oropharynx is clear.  Eyes:     General: No scleral icterus.        Right eye: No discharge.        Left eye: No discharge.     Conjunctiva/sclera: Conjunctivae normal.     Pupils: Pupils are equal, round, and reactive to light.  Neck:     Thyroid: No thyromegaly.     Vascular: No carotid bruit or JVD.  Cardiovascular:     Rate and Rhythm: Normal rate and regular rhythm.     Heart sounds: Normal heart sounds.  Pulmonary:     Effort: Pulmonary effort is normal. No respiratory distress.     Breath sounds: Normal breath sounds.  Chest:  Breasts:    Breasts are symmetrical.     Right: Normal. No swelling, inverted nipple, mass, nipple discharge, skin change or tenderness.     Left: Normal. No swelling, inverted nipple, mass, nipple discharge, skin change or tenderness.  Abdominal:     General: Bowel sounds are normal.     Palpations: Abdomen is soft. There is no mass.     Tenderness: There is no abdominal tenderness. There is no guarding or rebound.  Musculoskeletal:        General: Normal range of motion.     Cervical back: Normal range of motion and neck supple.  Lymphadenopathy:     Cervical: No cervical adenopathy.     Upper Body:     Right upper body: No supraclavicular, axillary or pectoral adenopathy.     Left upper body: No supraclavicular, axillary or pectoral adenopathy.  Skin:    General: Skin is warm and dry.  Neurological:     General: No focal deficit present.     Mental Status: She is alert and oriented to person, place, and time. Mental status is at baseline.  Psychiatric:        Mood and Affect: Mood normal.        Behavior: Behavior normal.        Thought Content: Thought content normal.        Judgment: Judgment normal.     Assessment and Plan: Colon cancer screening -     Ambulatory referral to Gastroenterology  Fatigue, unspecified type -     CBC with Differential/Platelet -     TSH  Impaired fasting glucose -     Hemoglobin A1c -     Comprehensive metabolic panel  Dyslipidemia -     Lipid panel -     LDL  cholesterol, direct  Chronic constipation Assessment & Plan: Previously Managed with [employer], which is no longer covered.  She has a History of diverticulitis. Trial of amitiza     Orders: -     Lubiprostone; Take 1 capsule (24 mcg total) by mouth 2 (two) times daily with a meal.  Dispense: 60 capsule; Refill: 2  Insomnia secondary to anxiety Assessment & Plan: Managed with Lunesta    Routine general medical examination at a health care facility Assessment & Plan: age appropriate education and counseling updated,  referrals for preventative services and immunizations addressed, dietary and smoking counseling addressed, most recent labs reviewed.  I have personally reviewed and have noted:   1) the patient's medical and social history 2) The pt's use of alcohol, tobacco, and illicit drugs 3) The patient's current medications and supplements 4) Functional ability including ADL's, fall risk, home safety risk, hearing and visual impairment 5) Diet and physical activities 6) Evidence for depression or mood disorder 7) The patient's height, weight, and BMI have been recorded in the chart   I have made referrals, and provided counseling and education based on review of the above    Other orders -     FLUoxetine HCl; Take 1 capsule (40 mg total) by mouth daily.  Dispense: 90 capsule; Refill: 1 -     Vitamin B-12; Take 1 tablet (1,000 mcg total) by mouth daily.    Return in about 1 year (around 06/26/2024).  Sherlene Shams, MD

## 2023-06-28 NOTE — Assessment & Plan Note (Signed)
Managed with [facility]

## 2023-06-28 NOTE — Assessment & Plan Note (Signed)

## 2023-06-28 NOTE — Assessment & Plan Note (Signed)
Previously Managed with [employer], which is no longer covered.  She has a History of diverticulitis. Trial of amitiza

## 2023-07-06 ENCOUNTER — Encounter: Payer: Self-pay | Admitting: *Deleted

## 2023-07-10 DIAGNOSIS — M7551 Bursitis of right shoulder: Secondary | ICD-10-CM | POA: Diagnosis not present

## 2023-07-10 DIAGNOSIS — M7541 Impingement syndrome of right shoulder: Secondary | ICD-10-CM | POA: Diagnosis not present

## 2023-07-26 ENCOUNTER — Telehealth: Payer: Self-pay

## 2023-07-26 NOTE — Telephone Encounter (Signed)
Message left for patient to return my call.  

## 2023-07-26 NOTE — Telephone Encounter (Signed)
Pt requested call back to schedule colonoscopy with Dr. Servando Snare

## 2023-07-27 ENCOUNTER — Telehealth: Payer: Self-pay | Admitting: *Deleted

## 2023-07-27 ENCOUNTER — Other Ambulatory Visit: Payer: Self-pay | Admitting: *Deleted

## 2023-07-27 DIAGNOSIS — Z86010 Personal history of colonic polyps: Secondary | ICD-10-CM

## 2023-07-27 NOTE — Telephone Encounter (Signed)
This is a reschedule from cancellation on 06/21/2022   Gastroenterology Pre-Procedure Review  Request Date: 08/16/2023 Requesting Physician: Dr. Servando Snare  PATIENT REVIEW QUESTIONS: The patient responded to the following health history questions as indicated:    1. Are you having any GI issues? no 2. Do you have a personal history of Polyps? yes (12/30/2016) 3. Do you have a family history of Colon Cancer or Polyps? no 4. Diabetes Mellitus? no 5. Joint replacements in the past 12 months?no 6. Major health problems in the past 3 months?no 7. Any artificial heart valves, MVP, or defibrillator?no    MEDICATIONS & ALLERGIES:    Patient reports the following regarding taking any anticoagulation/antiplatelet therapy:   Plavix, Coumadin, Eliquis, Xarelto, Lovenox, Pradaxa, Brilinta, or Effient? no Aspirin? no  Patient confirms/reports the following medications:  Current Outpatient Medications  Medication Sig Dispense Refill   ALPRAZolam (XANAX) 0.5 MG tablet Take 1 tablet (0.5 mg total) by mouth at bedtime as needed for anxiety. 30 tablet 3   Calcium Carb-Cholecalciferol (CALCIUM 1000 + D PO) Take 1 capsule by mouth daily.     cyanocobalamin (VITAMIN B12) 1000 MCG tablet Take 1 tablet (1,000 mcg total) by mouth daily.     estradiol (ESTRACE) 1 MG tablet TAKE ONE AND ONE-HALF TABLETS BY MOUTH ONE TIME DAILY 90 tablet 2   eszopiclone (LUNESTA) 2 MG TABS tablet TAKE 1 TABLET BY MOUTH EVERY NIGHT AT BEDTIME AS NEEDED FOR SLEEP 30 tablet 5   FLUoxetine (PROZAC) 40 MG capsule Take 1 capsule (40 mg total) by mouth daily. 90 capsule 1   lubiprostone (AMITIZA) 24 MCG capsule Take 1 capsule (24 mcg total) by mouth 2 (two) times daily with a meal. 60 capsule 2   valACYclovir (VALTREX) 1000 MG tablet Take 1 tablet (1,000 mg total) by mouth 3 (three) times daily. 21 tablet 3   No current facility-administered medications for this visit.    Patient confirms/reports the following allergies:  Allergies   Allergen Reactions   Capparis Spinosa Nausea And Vomiting    ([place])   Sulfa Antibiotics Rash    No orders of the defined types were placed in this encounter.   AUTHORIZATION INFORMATION Primary Insurance: 1D#: Group #:  Secondary Insurance: 1D#: Group #:  SCHEDULE INFORMATION: Date: 08/16/2023 Time: Location:  [facility]

## 2023-07-27 NOTE — Telephone Encounter (Signed)
Colonoscopy schedule on 08/16/2023 with Dr Servando Snare at [facility]

## 2023-08-09 ENCOUNTER — Encounter: Payer: Self-pay | Admitting: Gastroenterology

## 2023-08-15 ENCOUNTER — Encounter: Payer: Self-pay | Admitting: Gastroenterology

## 2023-08-16 ENCOUNTER — Other Ambulatory Visit: Payer: Self-pay

## 2023-08-16 ENCOUNTER — Ambulatory Visit: Payer: BC Managed Care – PPO | Admitting: Certified Registered"

## 2023-08-16 ENCOUNTER — Ambulatory Visit
Admission: RE | Admit: 2023-08-16 | Discharge: 2023-08-16 | Disposition: A | Discharge status: Home / Self Care | Payer: BC Managed Care – PPO | Attending: Gastroenterology | Admitting: Gastroenterology

## 2023-08-16 ENCOUNTER — Encounter: Payer: Self-pay | Admitting: Gastroenterology

## 2023-08-16 ENCOUNTER — Encounter
Admission: RE | Disposition: A | Discharge status: Home / Self Care | Payer: Self-pay | Source: Home / Self Care | Attending: Gastroenterology

## 2023-08-16 DIAGNOSIS — K5730 Diverticulosis of large intestine without perforation or abscess without bleeding: Secondary | ICD-10-CM | POA: Diagnosis not present

## 2023-08-16 DIAGNOSIS — Z09 Encounter for follow-up examination after completed treatment for conditions other than malignant neoplasm: Secondary | ICD-10-CM | POA: Insufficient documentation

## 2023-08-16 DIAGNOSIS — F32A Depression, unspecified: Secondary | ICD-10-CM | POA: Insufficient documentation

## 2023-08-16 DIAGNOSIS — Z860100 Personal history of colon polyps, unspecified: Secondary | ICD-10-CM | POA: Diagnosis not present

## 2023-08-16 DIAGNOSIS — F419 Anxiety disorder, unspecified: Secondary | ICD-10-CM | POA: Insufficient documentation

## 2023-08-16 DIAGNOSIS — D125 Benign neoplasm of sigmoid colon: Secondary | ICD-10-CM | POA: Insufficient documentation

## 2023-08-16 DIAGNOSIS — M1990 Unspecified osteoarthritis, unspecified site: Secondary | ICD-10-CM | POA: Insufficient documentation

## 2023-08-16 DIAGNOSIS — Z860101 Personal history of adenomatous and serrated colon polyps: Secondary | ICD-10-CM | POA: Diagnosis not present

## 2023-08-16 DIAGNOSIS — K635 Polyp of colon: Secondary | ICD-10-CM

## 2023-08-16 DIAGNOSIS — K642 Third degree hemorrhoids: Secondary | ICD-10-CM | POA: Diagnosis not present

## 2023-08-16 DIAGNOSIS — K5140 Inflammatory polyps of colon without complications: Secondary | ICD-10-CM | POA: Insufficient documentation

## 2023-08-16 DIAGNOSIS — K649 Unspecified hemorrhoids: Secondary | ICD-10-CM | POA: Diagnosis not present

## 2023-08-16 DIAGNOSIS — Z1211 Encounter for screening for malignant neoplasm of colon: Secondary | ICD-10-CM | POA: Insufficient documentation

## 2023-08-16 HISTORY — PX: POLYPECTOMY: SHX5525

## 2023-08-16 HISTORY — DX: Anxiety disorder, unspecified: F41.9

## 2023-08-16 HISTORY — DX: Cystocele, unspecified: N81.10

## 2023-08-16 HISTORY — PX: COLONOSCOPY WITH PROPOFOL: SHX5780

## 2023-08-16 HISTORY — DX: Other constipation: K59.09

## 2023-08-16 SURGERY — COLONOSCOPY WITH PROPOFOL
Anesthesia: General

## 2023-08-16 MED ORDER — PROPOFOL 10 MG/ML IV BOLUS
INTRAVENOUS | Status: DC | PRN
  Administered 2023-08-16: 40 mg via INTRAVENOUS
  Administered 2023-08-16: 20 mg via INTRAVENOUS

## 2023-08-16 MED ORDER — SODIUM CHLORIDE 0.9 % IV SOLN: INTRAVENOUS | Status: DC

## 2023-08-16 MED ORDER — PROPOFOL 500 MG/50ML IV EMUL
INTRAVENOUS | Status: DC | PRN
  Administered 2023-08-16: 135 ug/kg/min via INTRAVENOUS

## 2023-08-16 NOTE — Op Note (Signed)
[facility] Gastroenterology Patient Name: Hayley Lynn Procedure Date: 08/16/2023 8:59 AM MRN: [id number] Account #: [id number] Date of Birth: 1961-05-14 Admit Type: Outpatient Age: 62 Room: [facility] ENDO ROOM 4 Gender: Female Note Status: Finalized Instrument Name: Prentice Docker [id number] Procedure:             Colonoscopy Indications:           High risk colon cancer surveillance: Personal history                         of colonic polyps Providers:             Midge Minium MD, MD Referring MD:          Duncan Dull, MD (Referring MD) Medicines:             Propofol per Anesthesia Complications:         No immediate complications. Procedure:             Pre-Anesthesia Assessment:                        - Prior to the procedure, a History and Physical was                         performed, and patient medications and allergies were                         reviewed. The patient's tolerance of previous                         anesthesia was also reviewed. The risks and benefits                         of the procedure and the sedation options and risks                         were discussed with the patient. All questions were                         answered, and informed consent was obtained. Prior                         Anticoagulants: The patient has taken no anticoagulant                         or antiplatelet agents. ASA Grade Assessment: II - A                         patient with mild systemic disease. After reviewing                         the risks and benefits, the patient was deemed in                         satisfactory condition to undergo the procedure.                        After obtaining informed consent, the colonoscope was  passed under direct vision. Throughout the procedure,                         the patient's blood pressure, pulse, and oxygen                         saturations were monitored continuously. The                          Colonoscope was introduced through the anus and                         advanced to the the cecum, identified by appendiceal                         orifice and ileocecal valve. The colonoscopy was                         performed without difficulty. The patient tolerated                         the procedure well. The quality of the bowel                         preparation was excellent. Findings:      The perianal and digital rectal examinations were normal.      A 5 mm polyp was found in the sigmoid colon. The polyp was sessile. The       polyp was removed with a cold snare. Resection and retrieval were       complete.      Multiple small-mouthed diverticula were found in the sigmoid colon.      Non-bleeding internal hemorrhoids were found during retroflexion. The       hemorrhoids were Grade III (internal hemorrhoids that prolapse but       require manual reduction). Impression:            - One 5 mm polyp in the sigmoid colon, removed with a                         cold snare. Resected and retrieved.                        - Diverticulosis in the sigmoid colon.                        - Non-bleeding internal hemorrhoids. Recommendation:        - Discharge patient to home.                        - Resume previous diet.                        - Continue present medications.                        - Await pathology results.                        - Repeat colonoscopy in 7 years for surveillance. Procedure Code(s):     ---  Professional ---                        [phone], Colonoscopy, flexible; with removal of                         tumor(s), polyp(s), or other lesion(s) by snare                         technique Diagnosis Code(s):     --- Professional ---                        Z86.010, Personal history of colonic polyps                        D12.5, Benign neoplasm of sigmoid colon CPT copyright 2022 American Medical Association. All rights reserved. The codes  documented in this report are preliminary and upon coder review may  be revised to meet current compliance requirements. Midge Minium MD, MD 08/16/2023 9:38:46 AM This report has been signed electronically. Number of Addenda: 0 Note Initiated On: 08/16/2023 8:59 AM Scope Withdrawal Time: 0 hours 8 minutes 31 seconds  Total Procedure Duration: 0 hours 20 minutes 9 seconds  Estimated Blood Loss:  Estimated blood loss: none.      [facility]

## 2023-08-16 NOTE — Transfer of Care (Signed)
Immediate Anesthesia Transfer of Care Note  Patient: Hayley Lynn  Procedure(s) Performed: COLONOSCOPY WITH PROPOFOL POLYPECTOMY  Patient Location: PACU  Anesthesia Type:General  Level of Consciousness: drowsy  Airway & Oxygen Therapy: Patient Spontanous Breathing and Patient connected to face mask oxygen  Post-op Assessment: Report given to RN, Post -op Vital signs reviewed and stable, and Patient moving all extremities X 4  Post vital signs: Reviewed and stable  Last Vitals:  Vitals Value Taken Time  BP 87/60 08/16/23 0941  Temp    Pulse 75 08/16/23 0941  Resp 16 08/16/23 0941  SpO2 96 % 08/16/23 0941  Vitals shown include unfiled device data.  Last Pain:  Vitals:   08/16/23 0837  TempSrc: Temporal  PainSc: 0-No pain         Complications: No notable events documented.

## 2023-08-16 NOTE — Anesthesia Procedure Notes (Signed)
Procedure Name: MAC Date/Time: 08/16/2023 9:14 AM  Performed by: Nelle Don, CRNAPre-anesthesia Checklist: Patient identified, Emergency Drugs available, Suction available and Patient being monitored Oxygen Delivery Method: Simple face mask

## 2023-08-16 NOTE — H&P (Signed)
Midge Minium, MD [facility] [address], [address] [place], [place] [address] Phone:[phone] Fax : [phone]  Primary Care Physician:  Sherlene Shams, MD Primary Gastroenterologist:  Dr. Servando Snare  Pre-Procedure History & Physical: HPI:  Hayley Lynn is a 62 y.o. female is here for an colonoscopy.   Past Medical History:  Diagnosis Date   Anxiety    Arthritis    hands   Blood in stool    Chicken pox    Chronic constipation    Cystocele, unspecified    Depression    Diverticulitis    Headache    stress related   PONV (postoperative nausea and vomiting)    after hysterectomy   Squamous cell skin cancer, breast, female 10/2017   Wears contact lenses     Past Surgical History:  Procedure Laterality Date   ABDOMINAL HYSTERECTOMY     APPENDECTOMY     BREAST CYST ASPIRATION Right 2004   COLONOSCOPY     COLONOSCOPY WITH PROPOFOL N/A 12/30/2016   Procedure: COLONOSCOPY WITH PROPOFOL;  Surgeon: Midge Minium, MD;  Location: [facility];  Service: Endoscopy;  Laterality: N/A;   CYSTOCELE REPAIR     HEMORRHOID BANDING      Prior to Admission medications   Medication Sig Start Date End Date Taking? Authorizing Provider  ALPRAZolam Prudy Feeler) 0.5 MG tablet Take 1 tablet (0.5 mg total) by mouth at bedtime as needed for anxiety. 05/12/23  Yes Sherlene Shams, MD  Calcium Carb-Cholecalciferol (CALCIUM 1000 + D PO) Take 1 capsule by mouth daily.   Yes [provider]  cyanocobalamin (VITAMIN B12) 1000 MCG tablet Take 1 tablet (1,000 mcg total) by mouth daily. 06/27/23  Yes Sherlene Shams, MD  estradiol (ESTRACE) 1 MG tablet TAKE ONE AND ONE-HALF TABLETS BY MOUTH ONE TIME DAILY 05/29/23  Yes Sherlene Shams, MD  eszopiclone (LUNESTA) 2 MG TABS tablet TAKE 1 TABLET BY MOUTH EVERY NIGHT AT BEDTIME AS NEEDED FOR SLEEP 06/27/23  Yes Sherlene Shams, MD  FLUoxetine (PROZAC) 40 MG capsule Take 1 capsule (40 mg total) by mouth daily. 06/27/23  Yes Sherlene Shams, MD  ibuprofen  (ADVIL) 200 MG tablet Take 200 mg by mouth every 6 (six) hours as needed.   Yes [provider]  lubiprostone (AMITIZA) 24 MCG capsule Take 1 capsule (24 mcg total) by mouth 2 (two) times daily with a meal. 06/27/23  Yes Sherlene Shams, MD  valACYclovir (VALTREX) 1000 MG tablet Take 1 tablet (1,000 mg total) by mouth 3 (three) times daily. Patient not taking: Reported on 08/16/2023 06/16/19   Sherlene Shams, MD    Allergies as of 07/28/2023 - Review Complete 06/27/2023  Allergen Reaction Noted   Capparis spinosa Nausea And Vomiting 12/27/2016   Sulfa antibiotics Rash 09/05/2012    Family History  Problem Relation Age of Onset   Stroke Mother    Alcohol abuse Mother    Arthritis Mother    Hyperlipidemia Mother    Hypertension Mother    Arthritis Father    Hyperlipidemia Father    Heart disease Father    Stroke Brother    Diabetes Brother    Arthritis Paternal Grandmother    Hypertension Paternal Grandmother    Arthritis Paternal Grandfather    Hypertension Paternal Grandfather    Stroke Paternal Grandfather    Breast cancer Neg Hx     Social History   Socioeconomic History   Marital status: Married    Spouse name: Not  on file   Number of children: Not on file   Years of education: Not on file   Highest education level: Not on file  Occupational History   Not on file  Tobacco Use   Smoking status: Never   Smokeless tobacco: Never  Vaping Use   Vaping status: Never Used  Substance and Sexual Activity   Alcohol use: Yes    Comment: 1-2 drinks/month   Drug use: No   Sexual activity: Not on file  Other Topics Concern   Not on file  Social History Narrative   Not on file   Social Determinants of Health   Financial Resource Strain: Not on file  Food Insecurity: Not on file  Transportation Needs: Not on file  Physical Activity: Not on file  Stress: Not on file  Social Connections: Not on file  Intimate Partner Violence: Not on file    Review of  Systems: See HPI, otherwise negative ROS  Physical Exam: BP 115/81   Pulse 85   Temp (!) 97.4 F (36.3 C) (Temporal)   Wt 65.3 kg   SpO2 97%   BMI 25.51 kg/m  General:   Alert,  pleasant and cooperative in NAD Head:  Normocephalic and atraumatic. Neck:  Supple; no masses or thyromegaly. Lungs:  Clear throughout to auscultation.    Heart:  Regular rate and rhythm. Abdomen:  Soft, nontender and nondistended. Normal bowel sounds, without guarding, and without rebound.   Neurologic:  Alert and  oriented x4;  grossly normal neurologically.  Impression/Plan: Hayley Lynn is here for an colonoscopy to be performed for a history of adenomatous polyps on 2018   Risks, benefits, limitations, and alternatives regarding  colonoscopy have been reviewed with the patient.  Questions have been answered.  All parties agreeable.   Midge Minium, MD  08/16/2023, 9:10 AM

## 2023-08-16 NOTE — Anesthesia Preprocedure Evaluation (Signed)
Anesthesia Evaluation  Patient identified by MRN, date of birth, ID band Patient awake    Reviewed: Allergy & Precautions, H&P , NPO status , Patient's Chart, lab work & pertinent test results, reviewed documented beta blocker date and time   History of Anesthesia Complications (+) PONV and history of anesthetic complications  Airway Mallampati: I  TM Distance: >3 FB Neck ROM: full    Dental  (+) Dental Advidsory Given, Caps, Teeth Intact   Pulmonary neg pulmonary ROS, Continuous Positive Airway Pressure Ventilation    Pulmonary exam normal breath sounds clear to auscultation       Cardiovascular Exercise Tolerance: Good negative cardio ROS Normal cardiovascular exam Rhythm:regular Rate:Normal     Neuro/Psych  PSYCHIATRIC DISORDERS Anxiety Depression    negative neurological ROS     GI/Hepatic negative GI ROS, Neg liver ROS,,,  Endo/Other  negative endocrine ROS    Renal/GU negative Renal ROS  negative genitourinary   Musculoskeletal   Abdominal   Peds  Hematology negative hematology ROS (+)   Anesthesia Other Findings Past Medical History: No date: Anxiety No date: Arthritis     Comment:  hands No date: Blood in stool No date: Chicken pox No date: Chronic constipation No date: Cystocele, unspecified No date: Depression No date: Diverticulitis No date: Headache     Comment:  stress related No date: PONV (postoperative nausea and vomiting)     Comment:  after hysterectomy 10/2017: Squamous cell skin cancer, breast, female No date: Wears contact lenses   Reproductive/Obstetrics negative OB ROS                             Anesthesia Physical Anesthesia Plan  ASA: 2  Anesthesia Plan: General   Post-op Pain Management:    Induction: Intravenous  PONV Risk Score and Plan: 4 or greater and Propofol infusion and TIVA  Airway Management Planned: Natural Airway and Nasal  Cannula  Additional Equipment:   Intra-op Plan:   Post-operative Plan:   Informed Consent: I have reviewed the patients History and Physical, chart, labs and discussed the procedure including the risks, benefits and alternatives for the proposed anesthesia with the patient or authorized representative who has indicated his/her understanding and acceptance.     Dental Advisory Given  Plan Discussed with: Anesthesiologist, CRNA and Surgeon  Anesthesia Plan Comments:        Anesthesia Quick Evaluation

## 2023-08-17 ENCOUNTER — Encounter: Payer: Self-pay | Admitting: Gastroenterology

## 2023-08-17 LAB — SURGICAL PATHOLOGY

## 2023-08-18 ENCOUNTER — Encounter: Payer: Self-pay | Admitting: Gastroenterology

## 2023-08-21 DIAGNOSIS — S46001A Unspecified injury of muscle(s) and tendon(s) of the rotator cuff of right shoulder, initial encounter: Secondary | ICD-10-CM | POA: Diagnosis not present

## 2023-08-26 NOTE — Anesthesia Postprocedure Evaluation (Signed)
Anesthesia Post Note  Patient: Hayley Lynn  Procedure(s) Performed: COLONOSCOPY WITH PROPOFOL POLYPECTOMY  Patient location during evaluation: Endoscopy Anesthesia Type: General Level of consciousness: awake and alert Pain management: pain level controlled Vital Signs Assessment: post-procedure vital signs reviewed and stable Respiratory status: spontaneous breathing, nonlabored ventilation, respiratory function stable and patient connected to nasal cannula oxygen Cardiovascular status: blood pressure returned to baseline and stable Postop Assessment: no apparent nausea or vomiting Anesthetic complications: no   No notable events documented.   Last Vitals:  Vitals:   08/16/23 0941 08/16/23 0951  BP: (!) 87/60 99/77  Pulse: 74 76  Resp: 18 20  Temp:    SpO2: 96% 99%    Last Pain:  Vitals:   08/16/23 0951  TempSrc:   PainSc: 0-No pain                 Lenard Simmer

## 2023-09-06 DIAGNOSIS — M25511 Pain in right shoulder: Secondary | ICD-10-CM | POA: Diagnosis not present

## 2023-09-13 DIAGNOSIS — M75121 Complete rotator cuff tear or rupture of right shoulder, not specified as traumatic: Secondary | ICD-10-CM | POA: Diagnosis not present

## 2023-09-23 ENCOUNTER — Other Ambulatory Visit: Payer: Self-pay | Admitting: Internal Medicine

## 2023-10-16 DIAGNOSIS — M24111 Other articular cartilage disorders, right shoulder: Secondary | ICD-10-CM | POA: Diagnosis not present

## 2023-10-16 DIAGNOSIS — M7551 Bursitis of right shoulder: Secondary | ICD-10-CM | POA: Diagnosis not present

## 2023-10-16 DIAGNOSIS — M7521 Bicipital tendinitis, right shoulder: Secondary | ICD-10-CM | POA: Diagnosis not present

## 2023-10-16 DIAGNOSIS — S43431A Superior glenoid labrum lesion of right shoulder, initial encounter: Secondary | ICD-10-CM | POA: Diagnosis not present

## 2023-10-16 DIAGNOSIS — G8918 Other acute postprocedural pain: Secondary | ICD-10-CM | POA: Diagnosis not present

## 2023-10-16 DIAGNOSIS — M94211 Chondromalacia, right shoulder: Secondary | ICD-10-CM | POA: Diagnosis not present

## 2023-10-16 DIAGNOSIS — M7581 Other shoulder lesions, right shoulder: Secondary | ICD-10-CM | POA: Diagnosis not present

## 2023-10-16 DIAGNOSIS — M75121 Complete rotator cuff tear or rupture of right shoulder, not specified as traumatic: Secondary | ICD-10-CM | POA: Diagnosis not present

## 2023-10-19 DIAGNOSIS — M25511 Pain in right shoulder: Secondary | ICD-10-CM | POA: Diagnosis not present

## 2023-10-25 DIAGNOSIS — M25511 Pain in right shoulder: Secondary | ICD-10-CM | POA: Diagnosis not present

## 2023-10-27 DIAGNOSIS — M25511 Pain in right shoulder: Secondary | ICD-10-CM | POA: Diagnosis not present

## 2023-11-03 DIAGNOSIS — M25511 Pain in right shoulder: Secondary | ICD-10-CM | POA: Diagnosis not present

## 2023-11-10 ENCOUNTER — Telehealth: Payer: BC Managed Care – PPO | Admitting: Physician Assistant

## 2023-11-10 DIAGNOSIS — R3989 Other symptoms and signs involving the genitourinary system: Secondary | ICD-10-CM | POA: Diagnosis not present

## 2023-11-10 MED ORDER — NITROFURANTOIN MONOHYD MACRO 100 MG PO CAPS
100.0000 mg | ORAL_CAPSULE | Freq: Two times a day (BID) | ORAL | 0 refills | Status: AC
Start: 2023-11-10 — End: ?

## 2023-11-10 NOTE — Progress Notes (Signed)

## 2023-11-20 DIAGNOSIS — M25511 Pain in right shoulder: Secondary | ICD-10-CM | POA: Diagnosis not present

## 2023-11-23 DIAGNOSIS — M25511 Pain in right shoulder: Secondary | ICD-10-CM | POA: Diagnosis not present

## 2023-11-26 ENCOUNTER — Other Ambulatory Visit: Payer: Self-pay | Admitting: Internal Medicine

## 2023-12-06 DIAGNOSIS — M25511 Pain in right shoulder: Secondary | ICD-10-CM | POA: Diagnosis not present

## 2023-12-08 DIAGNOSIS — M25511 Pain in right shoulder: Secondary | ICD-10-CM | POA: Diagnosis not present

## 2023-12-13 DIAGNOSIS — M25511 Pain in right shoulder: Secondary | ICD-10-CM | POA: Diagnosis not present

## 2023-12-15 DIAGNOSIS — M25511 Pain in right shoulder: Secondary | ICD-10-CM | POA: Diagnosis not present

## 2023-12-19 DIAGNOSIS — M25511 Pain in right shoulder: Secondary | ICD-10-CM | POA: Diagnosis not present

## 2023-12-26 ENCOUNTER — Other Ambulatory Visit: Payer: Self-pay | Admitting: Internal Medicine

## 2023-12-27 NOTE — Telephone Encounter (Signed)
Lunesta Refilled: 06/27/2023  Alprazolam Refilled: 09/27/2023  Last OV: 06/27/2023 Next OV: not scheduled

## 2023-12-28 DIAGNOSIS — M25511 Pain in right shoulder: Secondary | ICD-10-CM | POA: Diagnosis not present

## 2024-01-02 DIAGNOSIS — M25511 Pain in right shoulder: Secondary | ICD-10-CM | POA: Diagnosis not present

## 2024-01-22 ENCOUNTER — Other Ambulatory Visit: Payer: Self-pay | Admitting: Internal Medicine

## 2024-01-23 NOTE — Telephone Encounter (Signed)
 Lunesta refilled: 12/27/2023 Alprazolam refilled: 12/27/2023  Last OV: 06/27/2023 Next OV: not scheduled

## 2024-01-24 DIAGNOSIS — M25511 Pain in right shoulder: Secondary | ICD-10-CM | POA: Diagnosis not present

## 2024-01-24 DIAGNOSIS — M9907 Segmental and somatic dysfunction of upper extremity: Secondary | ICD-10-CM | POA: Diagnosis not present

## 2024-01-31 DIAGNOSIS — M9907 Segmental and somatic dysfunction of upper extremity: Secondary | ICD-10-CM | POA: Diagnosis not present

## 2024-01-31 DIAGNOSIS — M25511 Pain in right shoulder: Secondary | ICD-10-CM | POA: Diagnosis not present

## 2024-02-07 ENCOUNTER — Ambulatory Visit: Admission: RE | Admit: 2024-02-07 | Source: Home / Self Care

## 2024-02-07 ENCOUNTER — Encounter: Payer: Self-pay | Admitting: Internal Medicine

## 2024-02-07 ENCOUNTER — Ambulatory Visit (INDEPENDENT_AMBULATORY_CARE_PROVIDER_SITE_OTHER): Admitting: Internal Medicine

## 2024-02-07 VITALS — BP 120/72 | HR 79 | Temp 98.0°F | Resp 16 | Ht 63.0 in | Wt 156.2 lb

## 2024-02-07 DIAGNOSIS — Z1231 Encounter for screening mammogram for malignant neoplasm of breast: Secondary | ICD-10-CM | POA: Diagnosis not present

## 2024-02-07 DIAGNOSIS — F419 Anxiety disorder, unspecified: Secondary | ICD-10-CM

## 2024-02-07 DIAGNOSIS — R5383 Other fatigue: Secondary | ICD-10-CM

## 2024-02-07 DIAGNOSIS — R002 Palpitations: Secondary | ICD-10-CM

## 2024-02-07 DIAGNOSIS — E785 Hyperlipidemia, unspecified: Secondary | ICD-10-CM

## 2024-02-07 DIAGNOSIS — E559 Vitamin D deficiency, unspecified: Secondary | ICD-10-CM | POA: Diagnosis not present

## 2024-02-07 DIAGNOSIS — R079 Chest pain, unspecified: Secondary | ICD-10-CM | POA: Diagnosis not present

## 2024-02-07 DIAGNOSIS — F5105 Insomnia due to other mental disorder: Secondary | ICD-10-CM

## 2024-02-07 DIAGNOSIS — R7301 Impaired fasting glucose: Secondary | ICD-10-CM

## 2024-02-07 LAB — CK TOTAL AND CKMB (NOT AT ARMC)
CK, MB: 0.7 ng/mL (ref 0–5.0)
Relative Index: 1.7 (ref 0–4.0)
Total CK: 42 U/L (ref 20–243)

## 2024-02-07 MED ORDER — FLUOXETINE HCL 40 MG PO CAPS: 40.0000 mg | ORAL_CAPSULE | Freq: Every day | ORAL | 1 refills | Status: DC

## 2024-02-07 NOTE — Patient Instructions (Addendum)
 Your EKG is unchanged, nothing worrisome  Workup for cardiac causes is in progress .   I have made a referral to  cardiology   Chest x ray today at [facility]

## 2024-02-07 NOTE — Progress Notes (Unsigned)
 Subjective:  Patient ID: Hayley Lynn, female    DOB: [birth year]  Age: 63 y.o. MRN: [id number]  CC: The primary encounter diagnosis was Vitamin D deficiency. Diagnoses of Encounter for screening mammogram for malignant neoplasm of breast, Fatigue, unspecified type, Impaired fasting glucose, Dyslipidemia, Chest pain in adult, Palpitations, and Insomnia secondary to anxiety were also pertinent to this visit.   HPI Hayley Lynn presents for    New onset Chest pain and palpitations occurring random for the past month  accompanied  by dyspnea. The pain is Substernal,  radiates to back.  Last occurrence was 48 hour sago,  occurred On and off all day Monday.   Denies exertional dyspnea but has been inactive. For the last several momths    Right rotator cuff  surgery r Dec 2024 in [place]   Outpatient Medications Prior to Visit  Medication Sig Dispense Refill   ALPRAZolam (XANAX) 0.5 MG tablet TAKE 1 TABLET BY MOUTH EVERY NIGHT AT BEDTIME AS NEEDED FOR ANXIETY 30 tablet 5   Calcium Carb-Cholecalciferol (CALCIUM 1000 + D PO) Take 1 capsule by mouth daily.     cyanocobalamin (VITAMIN B12) 1000 MCG tablet Take 1 tablet (1,000 mcg total) by mouth daily.     estradiol (ESTRACE) 1 MG tablet TAKE ONE AND ONE-HALF TABLETS BY MOUTH ONE TIME DAILY 90 tablet 2   eszopiclone (LUNESTA) 2 MG TABS tablet TAKE 1 TABLET BY MOUTH EVERY NIGHT AT BEDTIME AS NEEDED FOR SLEEP 30 tablet 5   ibuprofen (ADVIL) 200 MG tablet Take 200 mg by mouth every 6 (six) hours as needed.     nitrofurantoin, macrocrystal-monohydrate, (MACROBID) 100 MG capsule Take 1 capsule (100 mg total) by mouth 2 (two) times daily. 10 capsule 0   FLUoxetine (PROZAC) 40 MG capsule Take 1 capsule (40 mg total) by mouth daily. 90 capsule 1   lubiprostone (AMITIZA) 24 MCG capsule Take 1 capsule (24 mcg total) by mouth 2 (two) times daily with a meal. 60 capsule 2   valACYclovir (VALTREX) 1000 MG tablet Take 1 tablet (1,000 mg total) by mouth 3 (three)  times daily. (Patient not taking: Reported on 08/16/2023) 21 tablet 3   No facility-administered medications prior to visit.    Review of Systems;  Patient denies headache, fevers, malaise, unintentional weight loss, skin rash, eye pain, sinus congestion and sinus pain, sore throat, dysphagia,  hemoptysis , cough, wheezing,  palpitations, orthopnea, edema, abdominal pain, nausea, melena, diarrhea, constipation, flank pain, dysuria, hematuria, urinary  Frequency, nocturia, numbness, tingling, seizures,  Focal weakness, Loss of consciousness,  Tremor, insomnia, depression, anxiety, and suicidal ideation.      Objective:  BP 120/72   Pulse 79   Temp 98 F (36.7 C)   Resp 16   Ht 5\' 3"  (1.6 m)   Wt 156 lb 3.2 oz (70.9 kg)   SpO2 98%   BMI 27.67 kg/m   BP Readings from Last 3 Encounters:  02/07/24 120/72  08/16/23 99/77  06/27/23 102/60    Wt Readings from Last 3 Encounters:  02/07/24 156 lb 3.2 oz (70.9 kg)  08/16/23 144 lb (65.3 kg)  06/27/23 146 lb 12.8 oz (66.6 kg)    Physical Exam Vitals reviewed.  Constitutional:      General: She is not in acute distress.    Appearance: Normal appearance. She is normal weight. She is not ill-appearing, toxic-appearing or diaphoretic.  HENT:     Head: Normocephalic.  Eyes:     General: No scleral  icterus.       Right eye: No discharge.        Left eye: No discharge.     Conjunctiva/sclera: Conjunctivae normal.  Cardiovascular:     Rate and Rhythm: Normal rate and regular rhythm.     Heart sounds: Normal heart sounds.  Pulmonary:     Effort: Pulmonary effort is normal. No respiratory distress.     Breath sounds: Normal breath sounds.  Musculoskeletal:        General: Normal range of motion.  Skin:    General: Skin is warm and dry.  Neurological:     General: No focal deficit present.     Mental Status: She is alert and oriented to person, place, and time. Mental status is at baseline.  Psychiatric:        Mood and Affect:  Mood normal.        Behavior: Behavior normal.        Thought Content: Thought content normal.        Judgment: Judgment normal.    Lab Results  Component Value Date   HGBA1C 5.0 06/27/2023   HGBA1C 5.1 06/21/2022   HGBA1C 5.1 02/06/2018    Lab Results  Component Value Date   CREATININE 0.65 06/27/2023   CREATININE 0.63 06/21/2022   CREATININE 0.63 07/04/2021    Lab Results  Component Value Date   WBC 6.3 06/27/2023   HGB 12.2 06/27/2023   HCT 37.1 06/27/2023   PLT 307.0 06/27/2023   GLUCOSE 75 06/27/2023   CHOL 222 (H) 06/27/2023   TRIG 78.0 06/27/2023   HDL 76.00 06/27/2023   LDLDIRECT 144.0 06/27/2023   LDLCALC 131 (H) 06/27/2023   ALT 9 06/27/2023   AST 18 06/27/2023   NA 136 06/27/2023   K 4.1 06/27/2023   CL 100 06/27/2023   CREATININE 0.65 06/27/2023   BUN 15 06/27/2023   CO2 28 06/27/2023   TSH 1.86 06/27/2023   INR 0.9 02/22/2012   HGBA1C 5.0 06/27/2023    No results found.  Assessment & Plan:  .Vitamin D deficiency -     VITAMIN D 25 Hydroxy (Vit-D Deficiency, Fractures)  Encounter for screening mammogram for malignant neoplasm of breast -     3D Screening Mammogram, Left and Right; Future  Fatigue, unspecified type -     TSH -     CBC with Differential/Platelet  Impaired fasting glucose -     Comprehensive metabolic panel with GFR  Dyslipidemia -     Lipid panel -     LDL cholesterol, direct -     Comprehensive metabolic panel with GFR  Chest pain in adult Assessment & Plan: I have ordered and reviewed a 12 lead EKG and find that there are no acute changes and patient is in sinus rhythm.  Hest x ray ordered along with cardiac enzymes.  Referring to cardiology for evaluatoin    Orders: -     EKG 12-Lead -     Ambulatory referral to Cardiology -     CK total and CKMB (cardiac)not at [facility] -     DG Chest 2 View; Future -     Troponin I (High Sensitivity)  Palpitations -     Magnesium  Insomnia secondary to anxiety Assessment &  Plan: Managed with Lunesta . No changes today    Other orders -     FLUoxetine HCl; Take 1 capsule (40 mg total) by mouth daily.  Dispense: 90 capsule; Refill: 1  In addition to time spent ordering and reading an EKG,  I  spent 34 minutes on the day of this face to face encounter reviewing patient's   prior orthopedic surgery  and non surgical procedures, recent  labs and imaging studies, counseling on chest pain management,  reviewing the assessment and plan with patient, and post visit ordering and reviewing of  diagnostics and therapeutics with patient  .   Follow-up: No follow-ups on file.   Sherlene Shams, MD

## 2024-02-08 ENCOUNTER — Ambulatory Visit
Admission: RE | Admit: 2024-02-08 | Discharge: 2024-02-08 | Disposition: A | Discharge status: Home / Self Care | Source: Ambulatory Visit | Attending: Internal Medicine | Admitting: Internal Medicine

## 2024-02-08 ENCOUNTER — Ambulatory Visit
Admission: RE | Admit: 2024-02-08 | Discharge: 2024-02-08 | Disposition: A | Discharge status: Home / Self Care | Attending: Internal Medicine | Admitting: Internal Medicine

## 2024-02-08 DIAGNOSIS — R079 Chest pain, unspecified: Secondary | ICD-10-CM

## 2024-02-08 LAB — CBC WITH DIFFERENTIAL/PLATELET
Basophils Absolute: 0.1 10*3/uL (ref 0.0–0.1)
Basophils Relative: 0.9 % (ref 0.0–3.0)
Eosinophils Absolute: 0.1 10*3/uL (ref 0.0–0.7)
Eosinophils Relative: 1.4 % (ref 0.0–5.0)
HCT: 36.9 % (ref 36.0–46.0)
Hemoglobin: 12.6 g/dL (ref 12.0–15.0)
Lymphocytes Relative: 28.5 % (ref 12.0–46.0)
Lymphs Abs: 1.7 10*3/uL (ref 0.7–4.0)
MCHC: 34 g/dL (ref 30.0–36.0)
MCV: 93.5 fl (ref 78.0–100.0)
Monocytes Absolute: 0.4 10*3/uL (ref 0.1–1.0)
Monocytes Relative: 6.9 % (ref 3.0–12.0)
Neutro Abs: 3.7 10*3/uL (ref 1.4–7.7)
Neutrophils Relative %: 62.3 % (ref 43.0–77.0)
Platelets: 289 10*3/uL (ref 150.0–400.0)
RBC: 3.95 Mil/uL (ref 3.87–5.11)
RDW: 12.5 % (ref 11.5–15.5)
WBC: 6 10*3/uL (ref 4.0–10.5)

## 2024-02-08 LAB — LIPID PANEL
Cholesterol: 196 mg/dL (ref 0–200)
HDL: 75.3 mg/dL (ref >39.00)
LDL Cholesterol: 105 mg/dL — ABNORMAL HIGH (ref 0–99)
NonHDL: 120.55
Total CHOL/HDL Ratio: 3
Triglycerides: 79 mg/dL (ref 0.0–149.0)
VLDL: 15.8 mg/dL (ref 0.0–40.0)

## 2024-02-08 LAB — COMPREHENSIVE METABOLIC PANEL WITH GFR
ALT: 10 U/L (ref 0–35)
AST: 16 U/L (ref 0–37)
Albumin: 4 g/dL (ref 3.5–5.2)
Alkaline Phosphatase: 58 U/L (ref 39–117)
BUN: 16 mg/dL (ref 6–23)
CO2: 28 meq/L (ref 19–32)
Calcium: 8.7 mg/dL (ref 8.4–10.5)
Chloride: 101 meq/L (ref 96–112)
Creatinine, Ser: 0.64 mg/dL (ref 0.40–1.20)
GFR: 93.84 mL/min (ref >60.00)
Glucose, Bld: 77 mg/dL (ref 70–99)
Potassium: 4.2 meq/L (ref 3.5–5.1)
Sodium: 137 meq/L (ref 135–145)
Total Bilirubin: 0.3 mg/dL (ref 0.2–1.2)
Total Protein: 6.5 g/dL (ref 6.0–8.3)

## 2024-02-08 LAB — LDL CHOLESTEROL, DIRECT: Direct LDL: 102 mg/dL

## 2024-02-08 LAB — MAGNESIUM: Magnesium: 1.8 mg/dL (ref 1.5–2.5)

## 2024-02-08 NOTE — Assessment & Plan Note (Signed)
 Managed with Lunesta . No changes today

## 2024-02-08 NOTE — Assessment & Plan Note (Signed)
 I have ordered and reviewed a 12 lead EKG and find that there are no acute changes and patient is in sinus rhythm.  Hest x ray ordered along with cardiac enzymes.  Referring to cardiology for evaluatoin

## 2024-02-09 ENCOUNTER — Encounter: Payer: Self-pay | Admitting: Internal Medicine

## 2024-02-09 LAB — TROPONIN I (HIGH SENSITIVITY): High Sens Troponin I: <2 ng/L (ref 2–17)

## 2024-02-09 LAB — VITAMIN D 25 HYDROXY (VIT D DEFICIENCY, FRACTURES): VITD: 43.4 ng/mL (ref 30.00–100.00)

## 2024-02-09 LAB — TSH: TSH: 1.9 u[IU]/mL (ref 0.35–5.50)

## 2024-02-09 MED ORDER — NITROGLYCERIN 0.4 MG SL SUBL: 0.4000 mg | SUBLINGUAL_TABLET | SUBLINGUAL | 0 refills | Status: DC | PRN

## 2024-02-09 NOTE — Addendum Note (Signed)
 Addended by: Sherlene Shams on: 02/09/2024 06:57 AM   Modules accepted: Orders

## 2024-02-13 ENCOUNTER — Encounter: Payer: Self-pay | Admitting: Internal Medicine

## 2024-02-20 DIAGNOSIS — Z9889 Other specified postprocedural states: Secondary | ICD-10-CM | POA: Diagnosis not present

## 2024-03-04 ENCOUNTER — Ambulatory Visit: Admitting: Internal Medicine

## 2024-05-06 ENCOUNTER — Telehealth: Admitting: Family Medicine

## 2024-05-06 DIAGNOSIS — H00015 Hordeolum externum left lower eyelid: Secondary | ICD-10-CM | POA: Diagnosis not present

## 2024-05-06 MED ORDER — ERYTHROMYCIN 5 MG/GM OP OINT: 1.0000 | TOPICAL_OINTMENT | Freq: Every day | OPHTHALMIC | 0 refills | Status: DC

## 2024-05-06 NOTE — Progress Notes (Signed)
  E-Visit for Stye   We are sorry that you are not feeling well. Here is how we plan to help!  Based on what you have shared with me it looks like you have a stye.  A stye is an inflammation of the eyelid.  It is often a red, painful lump near the edge of the eyelid that may look like a boil or a pimple.  A stye develops when an infection occurs at the base of an eyelash.   We have made appropriate suggestions for you based upon your presentation: Simple styes can be treated without medical intervention.  Most styes either resolve spontaneously or resolve with simple home treatment by applying warm compresses or heated washcloth to the stye for about 10-15 minutes three to four times a day. This causes the stye to drain and resolve. and I have prescribed Erythromycin Ophthalmic ointment 0.5% Apply topically in affected eye daily at bedtime for 5 days. To apply: Tilt the head back and, pressing your finger gently on the skin just beneath the lower eyelid, pull the lower eyelid away from the eye to make a space. Squeeze a thin strip of ointment into this space. A 1-cm (approximately 1/3-inch) strip of ointment is usually enough, unless you have been told by your doctor to use a different amount. Let go of the eyelid and gently close the eyes. Keep the eyes closed for 1 or 2 minutes to allow the medicine to come into contact with the infection.      HOME CARE:  Wash your hands often! Let the stye open on its own. Don't squeeze or open it. Don't rub your eyes. This can irritate your eyes and let in bacteria.  If you need to touch your eyes, wash your hands first. Don't wear eye makeup or contact lenses until the area has healed.  GET HELP RIGHT AWAY IF:  Your symptoms do not improve. You develop blurred or loss of vision. Your symptoms worsen (increased discharge, pain or redness).   Thank you for choosing an e-visit.  Your e-visit answers were reviewed by a board certified advanced clinical  practitioner to complete your personal care plan. Depending upon the condition, your plan could have included both over the counter or prescription medications.  Please review your pharmacy choice. Make sure the pharmacy is open so you can pick up prescription now. If there is a problem, you may contact your provider through [employer] and have the prescription routed to another pharmacy.  Your safety is important to us . If you have drug allergies check your prescription carefully.   For the next 24 hours you can use MyChart to ask questions about today's visit, request a non-urgent call back, or ask for a work or school excuse. You will get an email in the next two days asking about your experience. I hope that your e-visit has been valuable and will speed your recovery.  I provided 5 minutes of non face-to-face time during this encounter for chart review, medication and order placement, as well as and documentation.

## 2024-05-20 DIAGNOSIS — E785 Hyperlipidemia, unspecified: Secondary | ICD-10-CM | POA: Insufficient documentation

## 2024-05-20 NOTE — Progress Notes (Unsigned)
 Cardiology Office Note  Date:  05/21/2024   ID:  Hayley Lynn, DOB 04-04-1961, MRN [id number]  PCP:  Marylynn Verneita CROME, MD   Chief Complaint  Patient presents with   New Patient (Initial Visit)    Ref by Dr. Marylynn for chest pain. Patient c/o chest pain at rest and occasional will wake up in the morning with chest pain and has shortness of breath.     HPI:  [name] is a 63 year old woman with past medical history of Hyperlipidemia Anxiety/insomnia Impaired fasting glucose Right rotator cuff surgery r Dec 2024 in [place]  Referred by Dr.  Marylynn for consultation of her chest pain  Seen by primary care February 07, 2024 Chest pain and palpitations occurring at random for the 1  month  accompanied  by dyspnea. The pain is Substernal,  radiates to back.   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, no exercise, ostomy nurse  Lab work reviewed Total cholesterol 196 LDL 102  EKG personally reviewed by myself on todays visit EKG Interpretation Date/Time:  Tuesday May 21 2024 14:55:50 EDT Ventricular Rate:  72 PR Interval:  134 QRS Duration:  70 QT Interval:  408 QTC Calculation: 446 R Axis:   62  Text Interpretation: Normal sinus rhythm Normal ECG When compared with ECG of 04-Jul-2021 15:18, No significant change was found Confirmed by Perla Lye [phone]) on 05/21/2024 2:57:44 PM   Family hx: Grandfather died 66s mi Father cabg 43 Brother with CVA and DM Sister ok  PMH:   has a past medical history of Anxiety, Arthritis, Blood in stool, Chicken pox, Chronic constipation, Cystocele, unspecified, Depression, Diverticulitis, Headache, PONV (postoperative nausea and vomiting), Squamous cell skin cancer, breast,  female (10/2017), and Wears contact lenses.  PSH:    Past Surgical History:  Procedure Laterality Date   ABDOMINAL HYSTERECTOMY     APPENDECTOMY     BREAST CYST ASPIRATION Right 2004   COLONOSCOPY     COLONOSCOPY WITH PROPOFOL  N/A 12/30/2016   Procedure: COLONOSCOPY WITH PROPOFOL ;  Surgeon: Rogelia Copping, MD;  Location: [facility];  Service: Endoscopy;  Laterality: N/A;   COLONOSCOPY WITH PROPOFOL  N/A 08/16/2023   Procedure: COLONOSCOPY WITH PROPOFOL ;  Surgeon: Copping Rogelia, MD;  Location: [facility];  Service: Endoscopy;  Laterality: N/A;   CYSTOCELE REPAIR     HEMORRHOID BANDING     POLYPECTOMY  08/16/2023   Procedure: POLYPECTOMY;  Surgeon: Copping Rogelia, MD;  Location: [facility];  Service: Endoscopy;;    Current Outpatient Medications  Medication Sig Dispense Refill   ALPRAZolam  (XANAX ) 0.5 MG tablet TAKE 1 TABLET BY MOUTH EVERY NIGHT AT BEDTIME AS NEEDED FOR ANXIETY 30 tablet 5   Calcium Carb-Cholecalciferol (CALCIUM 1000 + D PO) Take 1 capsule by mouth daily.     estradiol  (ESTRACE ) 1 MG tablet TAKE ONE AND ONE-HALF TABLETS BY MOUTH ONE TIME DAILY 90 tablet 2   eszopiclone  (LUNESTA ) 2 MG TABS tablet TAKE 1 TABLET BY MOUTH EVERY NIGHT AT BEDTIME AS NEEDED FOR SLEEP 30 tablet 5   FLUoxetine  (PROZAC ) 40 MG capsule Take 1 capsule (40 mg total) by mouth daily. 90 capsule 1   ibuprofen (ADVIL) 200 MG tablet Take 200 mg by mouth every  6 (six) hours as needed.     methocarbamol (ROBAXIN) 500 MG tablet Take 500 mg by mouth every 6 (six) hours as needed.     nitrofurantoin , macrocrystal-monohydrate, (MACROBID ) 100 MG capsule Take 1 capsule (100 mg total) by mouth 2 (two) times daily. 10 capsule 0   nitroGLYCERIN  (NITROSTAT ) 0.4 MG SL tablet Place 1 tablet (0.4 mg total) under the tongue every 5 (five) minutes as needed for chest pain. 50 tablet 0   cyanocobalamin (VITAMIN B12) 1000 MCG tablet Take 1 tablet (1,000 mcg total) by mouth daily. (Patient not taking: Reported on  05/21/2024)     erythromycin  ophthalmic ointment Place 1 Application into both eyes at bedtime. Apply a thin ribbon line to the eye lid (Patient not taking: Reported on 05/21/2024) 3.5 g 0   No current facility-administered medications for this visit.    Allergies:   Capparis spinosa and Sulfa antibiotics   Social History:  The patient  reports that she has never smoked. She has never used smokeless tobacco. She reports current alcohol use. She reports that she does not use drugs.   Family History:   family history includes Alcohol abuse in her mother; Arthritis in her father, mother, paternal grandfather, and paternal grandmother; Diabetes in her brother; Heart disease in her father; Hyperlipidemia in her father and mother; Hypertension in her mother, paternal grandfather, and paternal grandmother; Stroke in her brother, mother, and paternal grandfather.    Review of Systems: Review of Systems  Constitutional: Negative.   HENT: Negative.    Respiratory: Negative.    Cardiovascular:  Positive for chest pain.  Gastrointestinal: Negative.   Musculoskeletal: Negative.   Neurological: Negative.   Psychiatric/Behavioral: Negative.    All other systems reviewed and are negative.   PHYSICAL EXAM: VS:  BP 110/78 (BP Location: Left Arm, Patient Position: Sitting, Cuff Size: Normal)   Ht 5' 3 (1.6 m)   Wt 156 lb 8 oz (71 kg)   SpO2 98%   BMI 27.72 kg/m  , BMI Body mass index is 27.72 kg/m. GEN: Well nourished, well developed, in no acute distress HEENT: normal Neck: no JVD, carotid bruits, or masses Cardiac: RRR; no murmurs, rubs, or gallops,no edema  Respiratory:  clear to auscultation bilaterally, normal work of breathing GI: soft, nontender, nondistended, + BS MS: no deformity or atrophy Skin: warm and dry, no rash Neuro:  Strength and sensation are intact Psych: euthymic mood, full affect  Recent Labs: 02/07/2024: ALT 10; BUN 16; Creatinine, Ser 0.64; Hemoglobin 12.6; Magnesium  1.8; Platelets 289.0; Potassium 4.2; Sodium 137; TSH 1.90    Lipid Panel Lab Results  Component Value Date   CHOL 196 02/07/2024   HDL 75.30 02/07/2024   LDLCALC 105 (H) 02/07/2024   TRIG 79.0 02/07/2024      Wt Readings from Last 3 Encounters:  05/21/24 156 lb 8 oz (71 kg)  02/07/24 156 lb 3.2 oz (70.9 kg)  08/16/23 144 lb (65.3 kg)       ASSESSMENT AND PLAN:  Problem List Items Addressed This Visit       Cardiology Problems   Hyperlipidemia     Other   Chest pain of uncertain etiology - Primary   Chest pain Symptoms in March extending into April now essentially resolved.  At the time chest pain radiating through to her back. With her activities, unable to reproduce symptoms Strong family history grandfather, father, brother Discussed various options including echo, calcium score, cardiac CTA, Myoview Given symptoms have dramatically improved,  Recommend screening study with calcium scoring For markedly elevated calcium score could consider additional workup with cardiac CTA  Hyperlipidemia Currently not on medications Calcium score above to help guide management  Shortness of breath Rare episodes typically presenting at rest where she has to take a deep breath Normal EKG, normal clinical exam, calcium scoring as above If symptoms get worse could consider echocardiogram   Signed, Velinda Lunger, M.D., Ph.D. [facility] [place], [place] [phone]

## 2024-05-21 ENCOUNTER — Encounter: Payer: Self-pay | Admitting: Cardiovascular Disease

## 2024-05-21 ENCOUNTER — Ambulatory Visit: Attending: Cardiovascular Disease | Admitting: Cardiovascular Disease

## 2024-05-21 VITALS — BP 108/70 | HR 72 | Ht 63.0 in | Wt 156.5 lb

## 2024-05-21 DIAGNOSIS — R079 Chest pain, unspecified: Secondary | ICD-10-CM

## 2024-05-21 DIAGNOSIS — E782 Mixed hyperlipidemia: Secondary | ICD-10-CM

## 2024-05-21 NOTE — Patient Instructions (Addendum)
 Medication Instructions:   No changes  If you need a refill on your cardiac medications before your next appointment, please call your pharmacy.   Lab work: No new labs needed  Testing/Procedures:  CT coronary calcium score.   - $99 out of pocket cost at the time of your test - Call [phone] to schedule at your convenience.  [facility] [address] [place], [place] [address]   Coronary CalciumScan A coronary calcium scan is an imaging test used to look for deposits of calcium and other fatty materials (plaques) in the inner lining of the blood vessels of the heart (coronary arteries). These deposits of calcium and plaques can partly clog and narrow the coronary arteries without producing any symptoms or warning signs. This puts a person at risk for a heart attack. This test can detect these deposits before symptoms develop. Tell a health care provider about: Any allergies you have. All medicines you are taking, including vitamins, herbs, eye drops, creams, and over-the-counter medicines. Any problems you or family members have had with anesthetic medicines. Any blood disorders you have. Any surgeries you have had. Any medical conditions you have. Whether you are pregnant or may be pregnant. What are the risks? Generally, this is a safe procedure. However, problems may occur, including: Harm to a pregnant woman and her unborn baby. This test involves the use of radiation. Radiation exposure can be dangerous to a pregnant woman and her unborn baby. If you are pregnant, you generally should not have this procedure done. Slight increase in the risk of cancer. This is because of the radiation involved in the test. What happens before the procedure? No preparation is needed for this procedure. What happens during the procedure? You will undress and remove any jewelry around your neck or chest. You will put on a hospital gown. Sticky  electrodes will be placed on your chest. The electrodes will be connected to an electrocardiogram (ECG) machine to record a tracing of the electrical activity of your heart. A CT scanner will take pictures of your heart. During this time, you will be asked to lie still and hold your breath for 2-3 seconds while a picture of your heart is being taken. The procedure may vary among health care providers and hospitals. What happens after the procedure? You can get dressed. You can return to your normal activities. It is up to you to get the results of your test. Ask your health care provider, or the department that is doing the test, when your results will be ready. Summary A coronary calcium scan is an imaging test used to look for deposits of calcium and other fatty materials (plaques) in the inner lining of the blood vessels of the heart (coronary arteries). Generally, this is a safe procedure. Tell your health care provider if you are pregnant or may be pregnant. No preparation is needed for this procedure. A CT scanner will take pictures of your heart. You can return to your normal activities after the scan is done. This information is not intended to replace advice given to you by your health care provider. Make sure you discuss any questions you have with your health care provider. Document Released: 04/28/2008 Document Revised: 09/19/2016 Document Reviewed: 09/19/2016 Elsevier Interactive Patient Education  2017 [employer].   Follow-Up: At [facility], you and your health needs are our priority.  As part of our continuing mission to provide you with exceptional heart care, we have created  designated Provider Care Teams.  These Care Teams include your primary Cardiologist (physician) and Advanced Practice Providers (APPs -  Physician Assistants and Nurse Practitioners) who all work together to provide you with the care you need, when you need it.  You will need a follow up as  needed  Providers on your designated Care Team:   Lonni Meager, NP Bernardino Bring, PA-C Cadence [place], [place]  COVID-19 Vaccine Information can be found at: [email or web address] For questions related to vaccine distribution or appointments, please email vaccine@[email or web address] or call [phone].

## 2024-05-22 ENCOUNTER — Encounter: Payer: Self-pay | Admitting: Internal Medicine

## 2024-05-23 ENCOUNTER — Ambulatory Visit
Admission: RE | Admit: 2024-05-23 | Discharge: 2024-05-23 | Disposition: A | Discharge status: Home / Self Care | Payer: Self-pay | Source: Ambulatory Visit | Attending: Cardiovascular Disease | Admitting: Cardiovascular Disease

## 2024-05-23 DIAGNOSIS — E782 Mixed hyperlipidemia: Secondary | ICD-10-CM | POA: Insufficient documentation

## 2024-05-23 MED ORDER — ESTRADIOL 1 MG PO TABS: ORAL_TABLET | ORAL | 1 refills | Status: DC

## 2024-05-26 ENCOUNTER — Ambulatory Visit: Payer: Self-pay | Admitting: Cardiovascular Disease

## 2024-07-22 DIAGNOSIS — L249 Irritant contact dermatitis, unspecified cause: Secondary | ICD-10-CM | POA: Diagnosis not present

## 2024-07-22 DIAGNOSIS — L570 Actinic keratosis: Secondary | ICD-10-CM | POA: Diagnosis not present

## 2024-07-24 ENCOUNTER — Encounter: Payer: Self-pay | Admitting: Internal Medicine

## 2024-07-24 NOTE — Telephone Encounter (Signed)
  Last Visit: 02/07/2024 Next Visit: Visit date not found Last Refill: E-Prescribing Status: Receipt confirmed by pharmacy (01/24/2024  7:34 AM EDT)   Please Advise

## 2024-07-25 ENCOUNTER — Other Ambulatory Visit: Payer: Self-pay | Admitting: Internal Medicine

## 2024-07-25 ENCOUNTER — Encounter: Payer: Self-pay | Admitting: Internal Medicine

## 2024-07-25 NOTE — Telephone Encounter (Unsigned)
 Copied from CRM [id number]. Topic: Clinical - Medication Refill >> Jul 25, 2024 12:22 PM Macario HERO wrote: Medication: ALPRAZolam  (XANAX ) 0.5 MG tablet [[id number]] eszopiclone  (LUNESTA ) 2 MG TABS tablet [[id number]]  Has the patient contacted their pharmacy? Yes (Agent: If no, request that the patient contact the pharmacy for the refill. If patient does not wish to contact the pharmacy document the reason why and proceed with request.) (Agent: If yes, when and what did the pharmacy advise?)  This is the patient's preferred pharmacy:  [facility] [id number] [place], [place] - [address] [place] [address] Phone: [phone] Fax: [phone]   Is this the correct pharmacy for this prescription? Yes If no, delete pharmacy and type the correct one.   Has the prescription been filled recently? No  Is the patient out of the medication? Yes  Has the patient been seen for an appointment in the last year OR does the patient have an upcoming appointment? No  Can we respond through MyChart? Yes  Agent: Please be advised that Rx refills may take up to 3 business days. We ask that you follow-up with your pharmacy.

## 2024-07-26 ENCOUNTER — Other Ambulatory Visit: Payer: Self-pay | Admitting: Internal Medicine

## 2024-07-26 MED ORDER — ALPRAZOLAM 0.5 MG PO TABS: 0.5000 mg | ORAL_TABLET | Freq: Every evening | ORAL | 5 refills | Status: AC | PRN

## 2024-07-26 MED ORDER — ESZOPICLONE 3 MG PO TABS: 3.0000 mg | ORAL_TABLET | Freq: Every evening | ORAL | 5 refills | Status: AC | PRN

## 2024-08-02 ENCOUNTER — Other Ambulatory Visit: Payer: Self-pay | Admitting: Internal Medicine

## 2024-09-19 ENCOUNTER — Other Ambulatory Visit: Payer: Self-pay | Admitting: Internal Medicine

## 2024-09-25 ENCOUNTER — Other Ambulatory Visit: Payer: Self-pay | Admitting: Internal Medicine

## 2024-10-22 ENCOUNTER — Other Ambulatory Visit: Payer: Self-pay | Admitting: Internal Medicine

## 2024-10-24 ENCOUNTER — Emergency Department: Admission: EM | Admit: 2024-10-24 | Source: Home / Self Care

## 2024-10-24 MED ORDER — SODIUM CHLORIDE 0.9 % IV BOLUS: 1000.0000 mL | Freq: Once | INTRAVENOUS | Status: DC

## 2024-11-18 ENCOUNTER — Ambulatory Visit (INDEPENDENT_AMBULATORY_CARE_PROVIDER_SITE_OTHER): Admitting: Internal Medicine

## 2024-11-18 ENCOUNTER — Encounter: Payer: Self-pay | Admitting: Internal Medicine

## 2024-11-18 VITALS — BP 94/66 | HR 80 | Ht 63.0 in | Wt 163.8 lb

## 2024-11-18 DIAGNOSIS — R200 Anesthesia of skin: Secondary | ICD-10-CM | POA: Diagnosis not present

## 2024-11-18 DIAGNOSIS — M25511 Pain in right shoulder: Secondary | ICD-10-CM

## 2024-11-18 DIAGNOSIS — G8929 Other chronic pain: Secondary | ICD-10-CM | POA: Diagnosis not present

## 2024-11-18 DIAGNOSIS — R5383 Other fatigue: Secondary | ICD-10-CM | POA: Diagnosis not present

## 2024-11-18 DIAGNOSIS — F5105 Insomnia due to other mental disorder: Secondary | ICD-10-CM

## 2024-11-18 DIAGNOSIS — R7301 Impaired fasting glucose: Secondary | ICD-10-CM | POA: Diagnosis not present

## 2024-11-18 DIAGNOSIS — E782 Mixed hyperlipidemia: Secondary | ICD-10-CM

## 2024-11-18 DIAGNOSIS — F419 Anxiety disorder, unspecified: Secondary | ICD-10-CM

## 2024-11-18 DIAGNOSIS — R079 Chest pain, unspecified: Secondary | ICD-10-CM | POA: Diagnosis not present

## 2024-11-18 LAB — TSH: TSH: 2.64 u[IU]/mL (ref 0.35–5.50)

## 2024-11-18 LAB — COMPREHENSIVE METABOLIC PANEL WITH GFR
ALT: 11 U/L (ref 3–35)
AST: 17 U/L (ref 5–37)
Albumin: 3.9 g/dL (ref 3.5–5.2)
Alkaline Phosphatase: 55 U/L (ref 39–117)
BUN: 16 mg/dL (ref 6–23)
CO2: 28 meq/L (ref 19–32)
Calcium: 8.7 mg/dL (ref 8.4–10.5)
Chloride: 102 meq/L (ref 96–112)
Creatinine, Ser: 0.7 mg/dL (ref 0.40–1.20)
GFR: 91.34 mL/min
Glucose, Bld: 85 mg/dL (ref 70–99)
Potassium: 4.5 meq/L (ref 3.5–5.1)
Sodium: 136 meq/L (ref 135–145)
Total Bilirubin: 0.4 mg/dL (ref 0.2–1.2)
Total Protein: 6.6 g/dL (ref 6.0–8.3)

## 2024-11-18 LAB — CBC WITH DIFFERENTIAL/PLATELET
Basophils Absolute: 0.1 K/uL (ref 0.0–0.1)
Basophils Relative: 1 % (ref 0.0–3.0)
Eosinophils Absolute: 0.1 K/uL (ref 0.0–0.7)
Eosinophils Relative: 1.4 % (ref 0.0–5.0)
HCT: 38.2 % (ref 36.0–46.0)
Hemoglobin: 12.7 g/dL (ref 12.0–15.0)
Lymphocytes Relative: 27.8 % (ref 12.0–46.0)
Lymphs Abs: 1.6 K/uL (ref 0.7–4.0)
MCHC: 33.3 g/dL (ref 30.0–36.0)
MCV: 93.5 fl (ref 78.0–100.0)
Monocytes Absolute: 0.5 K/uL (ref 0.1–1.0)
Monocytes Relative: 8.4 % (ref 3.0–12.0)
Neutro Abs: 3.4 K/uL (ref 1.4–7.7)
Neutrophils Relative %: 61.4 % (ref 43.0–77.0)
Platelets: 294 K/uL (ref 150.0–400.0)
RBC: 4.09 Mil/uL (ref 3.87–5.11)
RDW: 12.5 % (ref 11.5–15.5)
WBC: 5.6 K/uL (ref 4.0–10.5)

## 2024-11-18 LAB — LIPID PANEL
Cholesterol: 239 mg/dL — ABNORMAL HIGH (ref 28–200)
HDL: 80 mg/dL
LDL Cholesterol: 136 mg/dL — ABNORMAL HIGH (ref 10–99)
NonHDL: 158.51
Total CHOL/HDL Ratio: 3
Triglycerides: 115 mg/dL (ref 10.0–149.0)
VLDL: 23 mg/dL (ref 0.0–40.0)

## 2024-11-18 LAB — LDL CHOLESTEROL, DIRECT: Direct LDL: 134 mg/dL

## 2024-11-18 LAB — HEMOGLOBIN A1C: Hgb A1c MFr Bld: 5 % (ref 4.6–6.5)

## 2024-11-18 MED ORDER — PREDNISONE 10 MG PO TABS: ORAL_TABLET | ORAL | 0 refills | Status: AC

## 2024-11-18 NOTE — Progress Notes (Signed)
 "  Subjective:  Patient ID: Hayley Lynn, female    DOB: [birth year]  Age: 64 y.o. MRN: [id number]  CC: The primary encounter diagnosis was Fatigue, unspecified type. Diagnoses of Mixed hyperlipidemia, Impaired fasting glucose, Bilateral hand numbness, Chest pain of uncertain etiology, Insomnia secondary to anxiety, and Chronic right shoulder pain were also pertinent to this visit.   HPI [name] presents for  Chief Complaint  Patient presents with   Medical Management of Chronic Issues    1) Right shoulder pain:  she notes that her shoulder  still hurts and has limited ROM despite right arthroscopic rotator cuff repair (supraspinatus and subscapularis) with biceps tenodesis. Patient has deficits in shoulder ROM, strength, and function that did not improve with PT   2) Bilateral hand numbness, started several moths ago with numbness involving the tips of thumb and index fingers.  She denies wrist pain. Started in right hand , symptoms are now involving both hands    3) Insomnia;  chronic. Using lunesta  and alprazolam   4) overweight:  has regained weight after losing weight with [employer] diet.  Not exercising.    Outpatient Medications Prior to Visit  Medication Sig Dispense Refill   ALPRAZolam  (XANAX ) 0.5 MG tablet Take 1 tablet (0.5 mg total) by mouth at bedtime as needed for anxiety. 30 tablet 5   Calcium Carb-Cholecalciferol (CALCIUM 1000 + D PO) Take 1 capsule by mouth daily.     estradiol  (ESTRACE ) 1 MG tablet TAKE 1.5 TABLET BY MOUTH DAILY 45 tablet 1   eszopiclone  3 MG TABS Take 1 tablet (3 mg total) by mouth at bedtime as needed. for sleep 30 tablet 5   FLUoxetine  (PROZAC ) 40 MG capsule TAKE 1 CAPSULE BY MOUTH DAILY 90 capsule 1   ibuprofen (ADVIL) 200 MG tablet Take 200 mg by mouth every 6 (six) hours as needed.     methocarbamol (ROBAXIN) 500 MG tablet Take 500 mg by mouth every 6 (six) hours as needed.     nitrofurantoin , macrocrystal-monohydrate, (MACROBID ) 100 MG  capsule Take 1 capsule (100 mg total) by mouth 2 (two) times daily. (Patient not taking: Reported on 11/18/2024) 10 capsule 0   nitroGLYCERIN  (NITROSTAT ) 0.4 MG SL tablet Place 1 tablet (0.4 mg total) under the tongue every 5 (five) minutes as needed for chest pain. (Patient not taking: Reported on 11/18/2024) 50 tablet 0   No facility-administered medications prior to visit.    Review of Systems;  Patient denies headache, fevers, malaise, unintentional weight loss, skin rash, eye pain, sinus congestion and sinus pain, sore throat, dysphagia,  hemoptysis , cough, dyspnea, wheezing, chest pain, palpitations, orthopnea, edema, abdominal pain, nausea, melena, diarrhea, constipation, flank pain, dysuria, hematuria, urinary  Frequency, nocturia,  seizures,  Focal weakness, Loss of consciousness,  Tremor, insomnia, depression, anxiety, and suicidal ideation.      Objective:  BP 94/66   Pulse 80   Ht 5' 3 (1.6 m)   Wt 163 lb 12.8 oz (74.3 kg)   SpO2 97%   BMI 29.02 kg/m   BP Readings from Last 3 Encounters:  11/18/24 94/66  05/21/24 108/70  02/07/24 120/72    Wt Readings from Last 3 Encounters:  11/18/24 163 lb 12.8 oz (74.3 kg)  05/21/24 156 lb 8 oz (71 kg)  02/07/24 156 lb 3.2 oz (70.9 kg)    Physical Exam Vitals reviewed.  Constitutional:      General: She is not in acute distress.    Appearance: Normal appearance. She is  normal weight. She is not ill-appearing, toxic-appearing or diaphoretic.  HENT:     Head: Normocephalic.  Eyes:     General: No scleral icterus.       Right eye: No discharge.        Left eye: No discharge.     Conjunctiva/sclera: Conjunctivae normal.  Cardiovascular:     Rate and Rhythm: Normal rate and regular rhythm.     Heart sounds: Normal heart sounds.  Pulmonary:     Effort: Pulmonary effort is normal. No respiratory distress.     Breath sounds: Normal breath sounds.  Musculoskeletal:     Right shoulder: Decreased range of motion. Decreased  strength.     Left shoulder: Normal.     Right hand: No swelling. Normal sensation.     Left hand: No swelling. Normal sensation.       Arms:  Skin:    General: Skin is warm and dry.  Neurological:     General: No focal deficit present.     Mental Status: She is alert and oriented to person, place, and time. Mental status is at baseline.  Psychiatric:        Mood and Affect: Mood normal.        Behavior: Behavior normal.        Thought Content: Thought content normal.        Judgment: Judgment normal.     Lab Results  Component Value Date   HGBA1C 5.0 11/18/2024   HGBA1C 5.0 06/27/2023   HGBA1C 5.1 06/21/2022    Lab Results  Component Value Date   CREATININE 0.70 11/18/2024   CREATININE 0.64 02/07/2024   CREATININE 0.65 06/27/2023    Lab Results  Component Value Date   WBC 5.6 11/18/2024   HGB 12.7 11/18/2024   HCT 38.2 11/18/2024   PLT 294.0 11/18/2024   GLUCOSE 85 11/18/2024   CHOL 239 (H) 11/18/2024   TRIG 115.0 11/18/2024   HDL 80.00 11/18/2024   LDLDIRECT 134.0 11/18/2024   LDLCALC 136 (H) 11/18/2024   ALT 11 11/18/2024   AST 17 11/18/2024   NA 136 11/18/2024   K 4.5 11/18/2024   CL 102 11/18/2024   CREATININE 0.70 11/18/2024   BUN 16 11/18/2024   CO2 28 11/18/2024   TSH 2.64 11/18/2024   INR 0.9 02/22/2012   HGBA1C 5.0 11/18/2024    No results found.  Assessment & Plan:  .Fatigue, unspecified type -     CBC with Differential/Platelet -     TSH  Mixed hyperlipidemia -     Lipid panel -     LDL cholesterol, direct  Impaired fasting glucose -     Comprehensive metabolic panel with GFR  Bilateral hand numbness Assessment & Plan:  Ruling out amyloid, diabetes and thyroid ,   Orders: -     Hemoglobin A1c -     TSH -     Protein electrophoresis, serum -     IFE AND PE, RANDOM URINE -     B12 and Folate Panel; Future  Chest pain of uncertain etiology Assessment & Plan: Noninvasive workup has been done with a coronary calcium CT scan;   her  score was zero.    Insomnia secondary to anxiety Assessment & Plan: Managed with Lunesta  3 mg with occasional addition of low dose of alprazolam  /   No changes today    Chronic right shoulder pain Assessment & Plan: S/pright arthroscopic rotator cuff repair (supraspinatus and subscapularis) with biceps tenodesis in  Dec 2024. Patient continues to have deficits in shoulder ROM, strength, and function  but is not interested in any more surgery and feels that the PT was not worth repeating    Other orders -     predniSONE ; 6 tablets daily for 3 days, then reduce by 1 tablet daily until gone  Dispense: 33 tablet; Refill: 0     I spent 34 minutes on the day of this face to face encounter reviewing patient's  most recent visit with cardiology, orthopedics,  prior relevant surgical and non surgical procedures, recent  labs and imaging studies, counseling on weight management,  reviewing the assessment and plan with patient, and post visit ordering and reviewing of  diagnostics and therapeutics with patient  .   Follow-up: Return in about 6 months (around 05/18/2025) for physical.   Verneita LITTIE Kettering, MD "

## 2024-11-18 NOTE — Assessment & Plan Note (Signed)
 S/pright arthroscopic rotator cuff repair (supraspinatus and subscapularis) with biceps tenodesis in Dec 2024. Patient continues to have deficits in shoulder ROM, strength, and function  but is not interested in any more surgery and feels that the PT was not worth repeating

## 2024-11-18 NOTE — Assessment & Plan Note (Signed)
"   Ruling out amyloid, diabetes and thyroid ,  "

## 2024-11-18 NOTE — Assessment & Plan Note (Signed)
 Managed with Lunesta  3 mg with occasional addition of low dose of alprazolam  /   No changes today

## 2024-11-18 NOTE — Assessment & Plan Note (Signed)
 Noninvasive workup has been done with a coronary calcium CT scan;  her  score was zero.

## 2024-11-19 ENCOUNTER — Ambulatory Visit: Payer: Self-pay | Admitting: Internal Medicine

## 2024-11-19 ENCOUNTER — Ambulatory Visit (INDEPENDENT_AMBULATORY_CARE_PROVIDER_SITE_OTHER)

## 2024-11-19 DIAGNOSIS — R200 Anesthesia of skin: Secondary | ICD-10-CM | POA: Diagnosis not present

## 2024-11-19 DIAGNOSIS — E538 Deficiency of other specified B group vitamins: Secondary | ICD-10-CM

## 2024-11-19 LAB — B12 AND FOLATE PANEL
Folate: 10.8 ng/mL
Vitamin B-12: 271 pg/mL (ref 211–911)

## 2024-11-19 MED ORDER — "SYRINGE 25G X 1"" 3 ML MISC": 0 refills | Status: AC

## 2024-11-19 MED ORDER — CYANOCOBALAMIN 1000 MCG/ML IJ SOLN: 1000.0000 ug | INTRAMUSCULAR | 3 refills | Status: AC

## 2024-11-19 NOTE — Assessment & Plan Note (Signed)
 B12 is < 300 ,  replacing IM

## 2024-11-20 LAB — IFE AND PE, RANDOM URINE: Protein, Ur: 6 mg/dL

## 2024-11-21 LAB — PROTEIN ELECTROPHORESIS, SERUM
Albumin ELP: 4 g/dL (ref 3.8–4.8)
Alpha 1: 0.2 g/dL (ref 0.2–0.3)
Alpha 2: 0.7 g/dL (ref 0.5–0.9)
Beta 2: 0.4 g/dL (ref 0.2–0.5)
Beta Globulin: 0.4 g/dL (ref 0.4–0.6)
Gamma Globulin: 0.9 g/dL (ref 0.8–1.7)
Total Protein: 6.7 g/dL (ref 6.1–8.1)

## 2024-11-22 NOTE — Addendum Note (Signed)
 Addended by: MARYLYNN VERNEITA CROME on: 11/22/2024 07:48 AM   Modules accepted: Orders

## 2024-12-19 ENCOUNTER — Other Ambulatory Visit: Payer: Self-pay | Admitting: Internal Medicine
